# Patient Record
Sex: Female | Born: 1955 | Race: White | Hispanic: No | Smoking: Never smoker
Health system: Southern US, Community
[De-identification: ages and names within clinical notes are randomized; demographics above are authoritative.]

## PROBLEM LIST (undated history)

## (undated) DIAGNOSIS — L719 Rosacea, unspecified: Secondary | ICD-10-CM

## (undated) DIAGNOSIS — D649 Anemia, unspecified: Secondary | ICD-10-CM

## (undated) DIAGNOSIS — Z8719 Personal history of other diseases of the digestive system: Secondary | ICD-10-CM

## (undated) DIAGNOSIS — H269 Unspecified cataract: Secondary | ICD-10-CM

## (undated) DIAGNOSIS — N939 Abnormal uterine and vaginal bleeding, unspecified: Secondary | ICD-10-CM

## (undated) DIAGNOSIS — R011 Cardiac murmur, unspecified: Secondary | ICD-10-CM

## (undated) DIAGNOSIS — M1990 Unspecified osteoarthritis, unspecified site: Secondary | ICD-10-CM

## (undated) HISTORY — PX: OTHER SURGICAL HISTORY: SHX169

## (undated) HISTORY — DX: Rosacea, unspecified: L71.9

## (undated) HISTORY — PX: DILATION AND CURETTAGE OF UTERUS: SHX78

## (undated) HISTORY — DX: Unspecified cataract: H26.9

## (undated) HISTORY — PX: APPENDECTOMY: SHX54

## (undated) HISTORY — PX: TUBAL LIGATION: SHX77

## (undated) HISTORY — PX: ABCESS DRAINAGE: SHX399

## (undated) HISTORY — PX: WISDOM TOOTH EXTRACTION: SHX21

---

## 2005-02-09 ENCOUNTER — Other Ambulatory Visit: Admission: RE | Admit: 2005-02-09 | Discharge: 2005-02-09 | Payer: Self-pay | Admitting: Obstetrics and Gynecology

## 2005-02-16 ENCOUNTER — Encounter: Admission: RE | Admit: 2005-02-16 | Discharge: 2005-02-16 | Payer: Self-pay | Admitting: Obstetrics and Gynecology

## 2005-08-05 ENCOUNTER — Encounter: Admission: RE | Admit: 2005-08-05 | Discharge: 2005-08-05 | Payer: Self-pay | Admitting: Internal Medicine

## 2005-08-11 ENCOUNTER — Encounter: Admission: RE | Admit: 2005-08-11 | Discharge: 2005-08-11 | Payer: Self-pay | Admitting: Internal Medicine

## 2005-11-11 ENCOUNTER — Encounter: Admission: RE | Admit: 2005-11-11 | Discharge: 2005-11-11 | Payer: Self-pay | Admitting: Internal Medicine

## 2005-11-17 ENCOUNTER — Other Ambulatory Visit: Admission: RE | Admit: 2005-11-17 | Discharge: 2005-11-17 | Payer: Self-pay | Admitting: Obstetrics and Gynecology

## 2006-02-08 ENCOUNTER — Encounter: Admission: RE | Admit: 2006-02-08 | Discharge: 2006-02-08 | Payer: Self-pay | Admitting: Internal Medicine

## 2006-03-22 ENCOUNTER — Encounter (INDEPENDENT_AMBULATORY_CARE_PROVIDER_SITE_OTHER): Payer: Self-pay | Admitting: Specialist

## 2006-03-22 ENCOUNTER — Ambulatory Visit (HOSPITAL_COMMUNITY): Admission: RE | Admit: 2006-03-22 | Discharge: 2006-03-22 | Payer: Self-pay | Admitting: Obstetrics and Gynecology

## 2007-10-11 ENCOUNTER — Encounter: Admission: RE | Admit: 2007-10-11 | Discharge: 2007-10-11 | Payer: Self-pay | Admitting: Internal Medicine

## 2009-08-18 ENCOUNTER — Inpatient Hospital Stay (HOSPITAL_COMMUNITY): Admission: AD | Admit: 2009-08-18 | Discharge: 2009-08-18 | Payer: Self-pay | Admitting: Obstetrics and Gynecology

## 2009-08-18 ENCOUNTER — Encounter: Payer: Self-pay | Admitting: Internal Medicine

## 2009-08-24 ENCOUNTER — Ambulatory Visit: Payer: Self-pay | Admitting: Family Medicine

## 2009-08-24 ENCOUNTER — Ambulatory Visit (HOSPITAL_COMMUNITY): Admission: AD | Admit: 2009-08-24 | Discharge: 2009-08-24 | Payer: Self-pay | Admitting: Obstetrics & Gynecology

## 2010-06-22 IMAGING — US US PELVIS COMPLETE MODIFY
1 series · 14 of 25 positions shown · non-contrast
Comparison: None.

CLINICAL DATA: Heavy vaginal bleeding today.  Passing large clots
of blood.  Prior D&C.  The patient is gravida 2, para 2 with LMP
07/16/2009.

TRANSABDOMINAL AND TRANSVAGINAL ULTRASOUND OF PELVIS
TECHNIQUE: Both transabdominal and transvaginal ultrasound
examinations of the pelvis were performed including evaluation of
the uterus, ovaries, adnexal regions, and pelvic cul-de-sac.

[Series 1: us pelvis complete modify · 0.30mm/px · 14 of 49 slices shown]
[im 1/49]
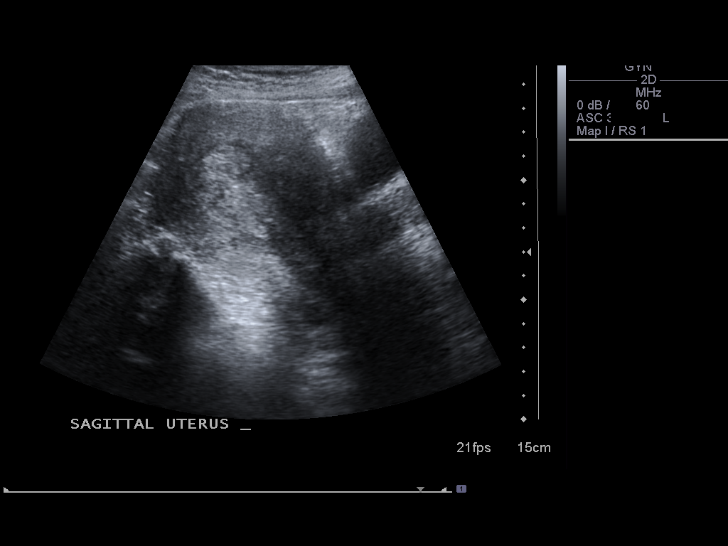
[im 5/49]
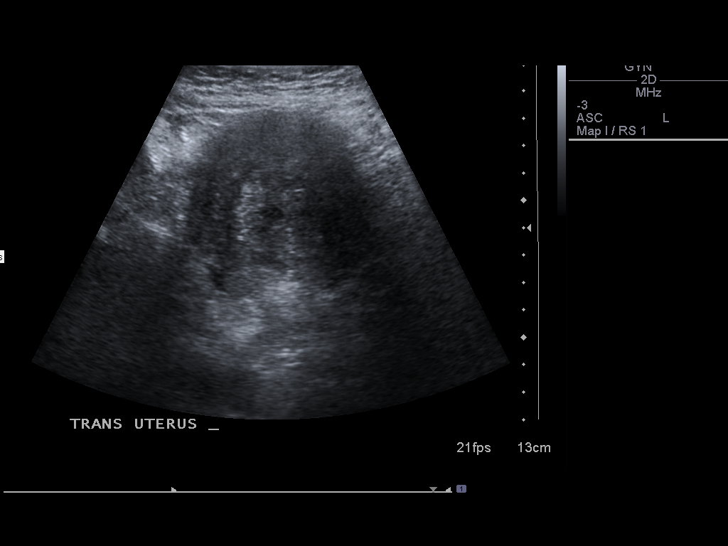
[im 9/49]
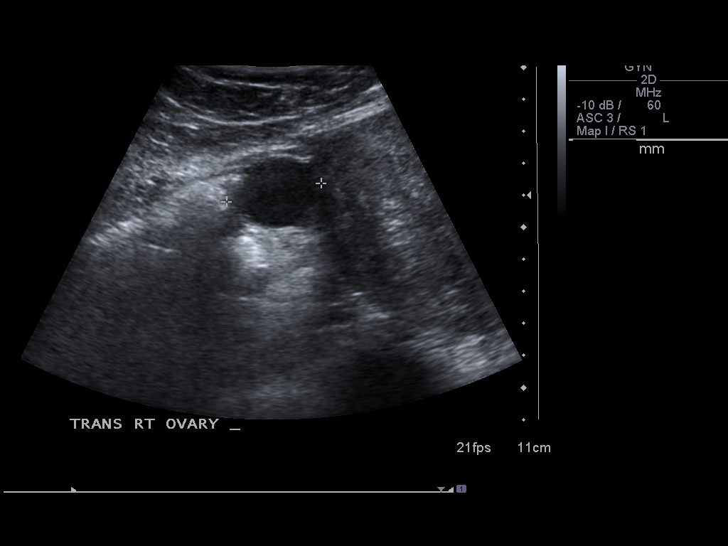
[im 13/49]
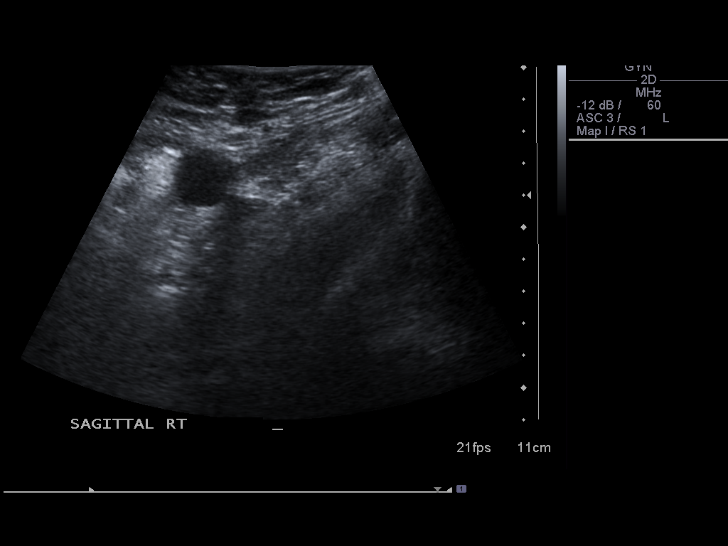
[im 17/49]
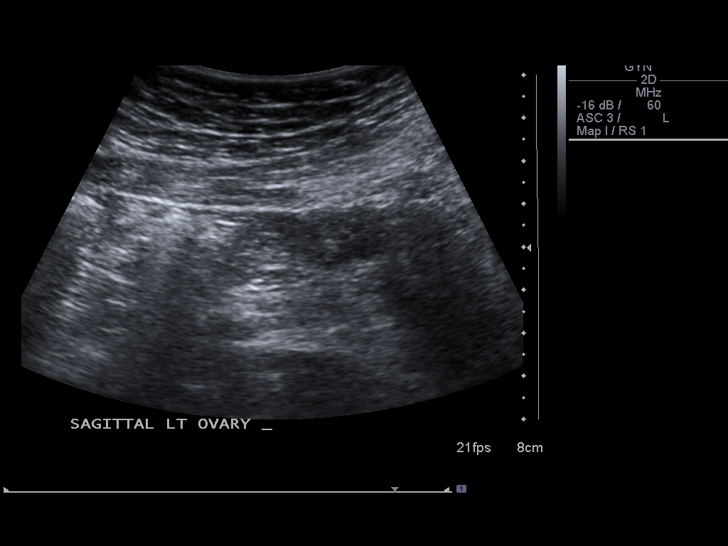
[im 19/49]
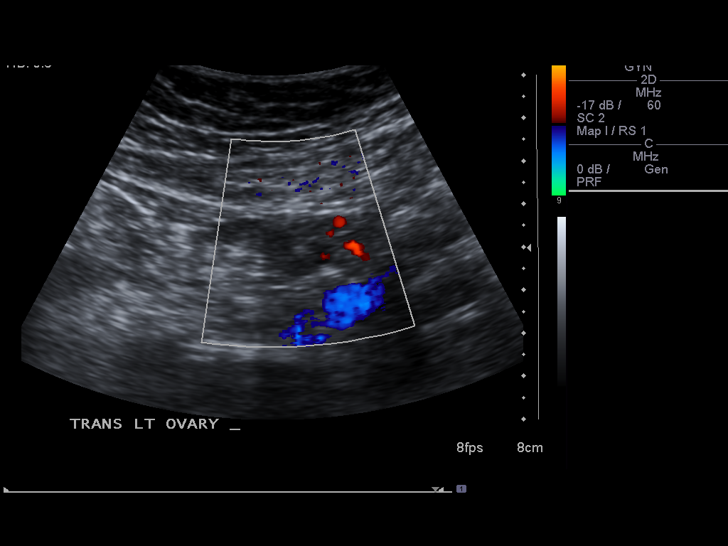
[im 23/49]
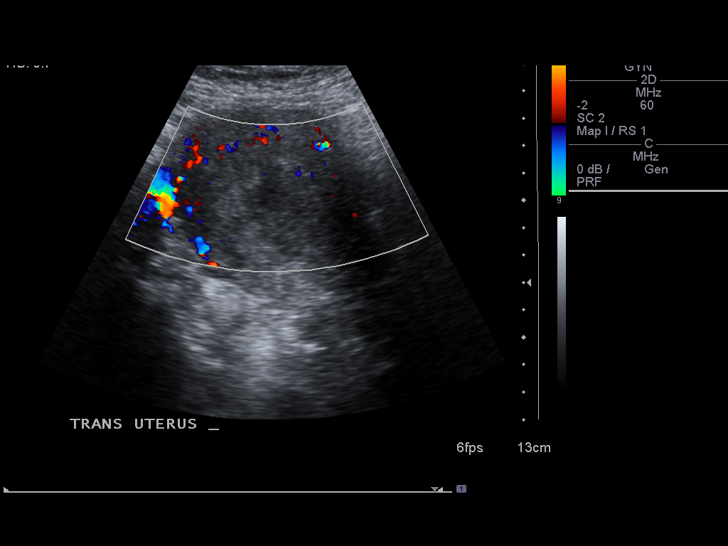
[im 27/49]
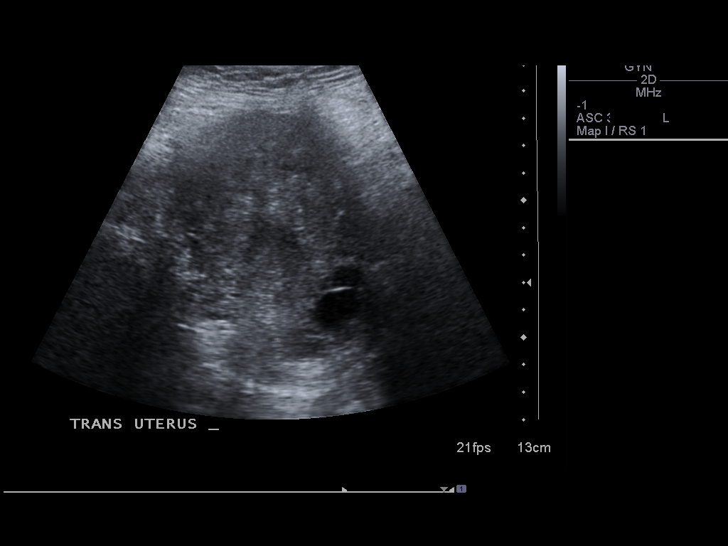
[im 31/49]
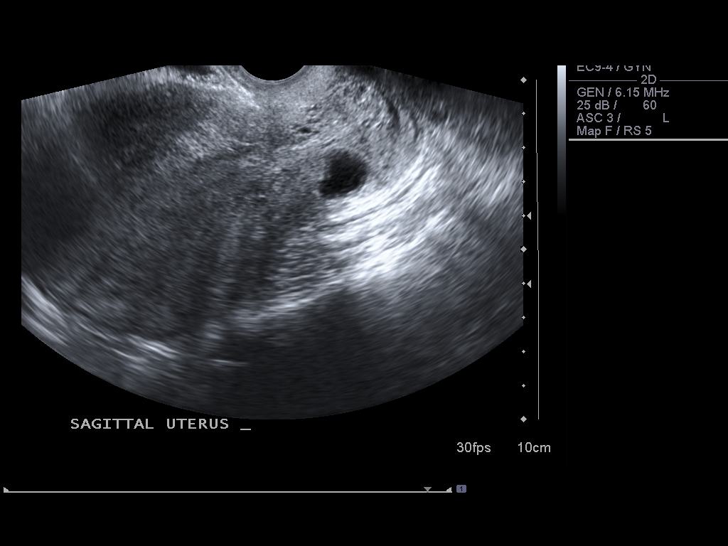
[im 33/49]
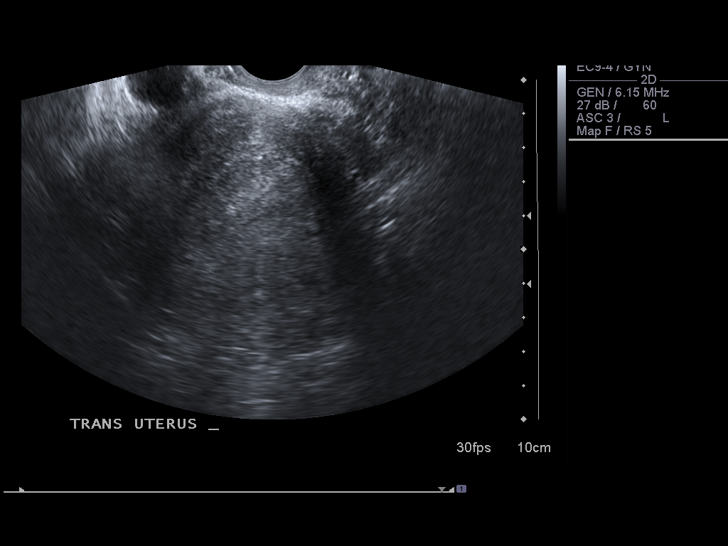
[im 37/49]
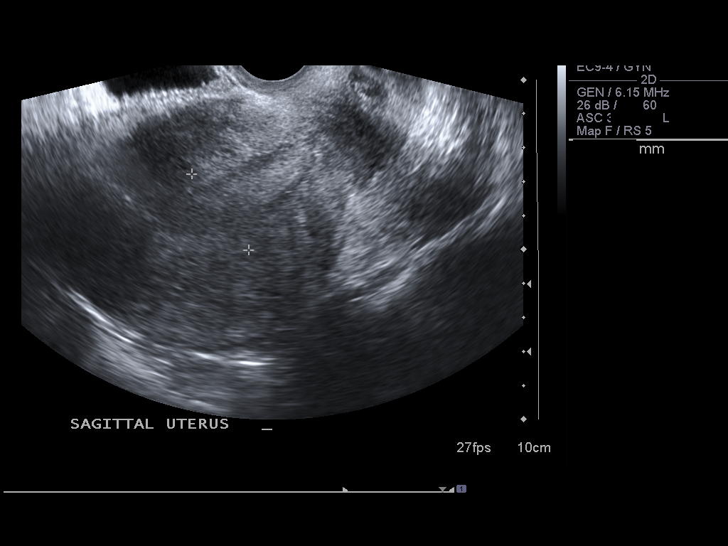
[im 41/49]
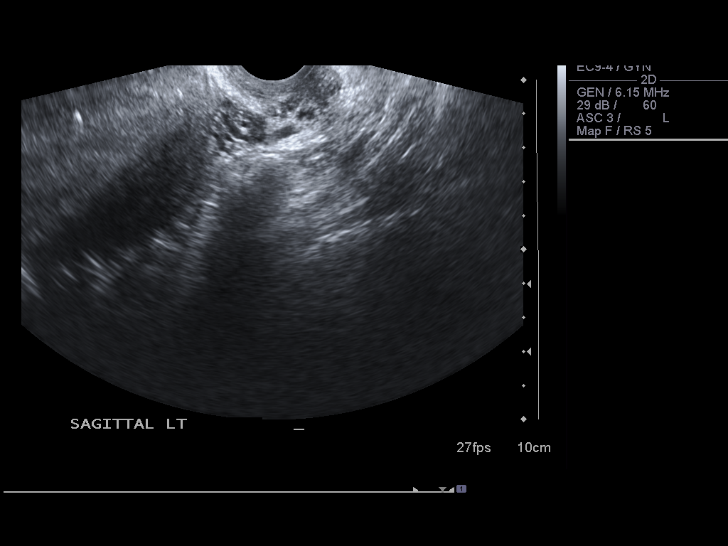
[im 45/49]
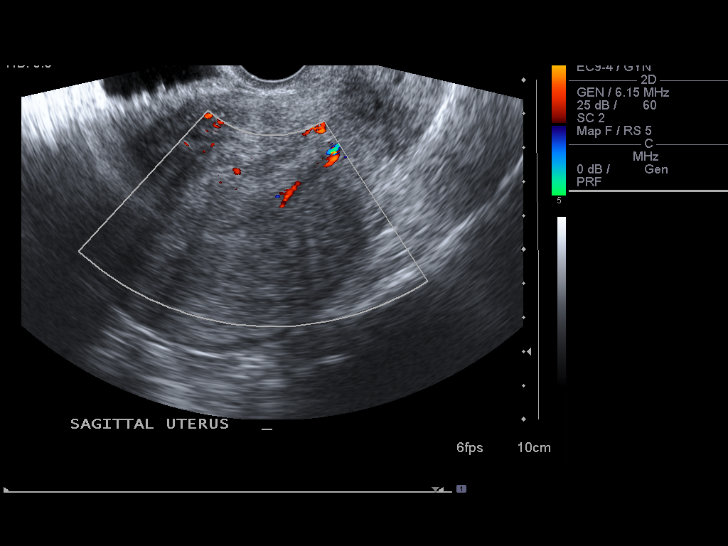
[im 49/49]
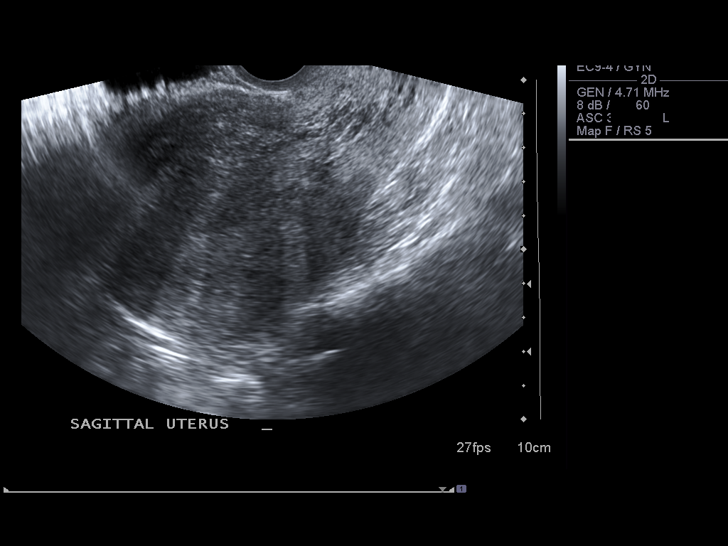

[14 of 25 positions shown; findings below may reference images not displayed]

FINDINGS: Uterus measures 10.5 x 7.4 x 7.5 cm.  The uterus is heterogeneous.
Note is made of multiple Nabothian cysts.

Endometrium measures 2.8 cm.  The endometrium is thickened and
contains echogenic clot.

Right Ovary measures 3.3 x 2.2 x 3.0 cm and has a normal
appearance.

Left Ovary measures 2.1 x 1.6 x 1.6 cm.

Other Findings:  No free pelvic fluid identified.
IMPRESSION: 1.  Thickened endometrium, containing echogenic clot.  If bleeding
is of responsive to hormonal therapy, endometrial biopsy should be
considered.
2.  Normal-appearing ovaries.

## 2010-07-18 ENCOUNTER — Encounter: Payer: Self-pay | Admitting: Internal Medicine

## 2010-09-15 LAB — CROSSMATCH: ABO/RH(D): A POS

## 2010-09-15 LAB — WET PREP, GENITAL
Clue Cells Wet Prep HPF POC: NONE SEEN
Yeast Wet Prep HPF POC: NONE SEEN

## 2010-09-15 LAB — CBC
HCT: 19.3 % — ABNORMAL LOW (ref 36.0–46.0)
Hemoglobin: 6.1 g/dL — CL (ref 12.0–15.0)
MCHC: 31.7 g/dL (ref 30.0–36.0)

## 2010-09-17 LAB — POCT I-STAT, CHEM 8
BUN: 10 mg/dL (ref 6–23)
Calcium, Ion: 1.04 mmol/L — ABNORMAL LOW (ref 1.12–1.32)
HCT: 33 % — ABNORMAL LOW (ref 36.0–46.0)
Hemoglobin: 11.2 g/dL — ABNORMAL LOW (ref 12.0–15.0)
Sodium: 136 mEq/L (ref 135–145)

## 2010-09-17 LAB — TYPE AND SCREEN: ABO/RH(D): A POS

## 2010-09-17 LAB — DIFFERENTIAL
Basophils Absolute: 0 10*3/uL (ref 0.0–0.1)
Eosinophils Absolute: 0 10*3/uL (ref 0.0–0.7)
Eosinophils Relative: 0 % (ref 0–5)
Lymphocytes Relative: 9 % — ABNORMAL LOW (ref 12–46)
Monocytes Relative: 8 % (ref 3–12)
Neutrophils Relative %: 83 % — ABNORMAL HIGH (ref 43–77)

## 2010-09-17 LAB — POCT PREGNANCY, URINE: Preg Test, Ur: NEGATIVE

## 2010-09-17 LAB — PROTIME-INR
INR: 1.1 (ref 0.00–1.49)
Prothrombin Time: 14.1 seconds (ref 11.6–15.2)

## 2010-09-17 LAB — CBC: MCHC: 32.9 g/dL (ref 30.0–36.0)

## 2010-11-12 NOTE — Op Note (Signed)
Nancy Fields, Nancy Fields NO.:  [id number]   MEDICAL RECORD NO.:  [id number]          PATIENT TYPE:  AMB   LOCATION:  [facility]                           FACILITY:  [facility]   PHYSICIAN:  Artist Pais, M.D.    DATE OF BIRTH:  [birth year]   DATE OF PROCEDURE:  03/22/2006  DATE OF DISCHARGE:  03/22/2006                                 OPERATIVE REPORT   PREOPERATIVE DIAGNOSES:  Menorrhagia, endometrial polyps.   POSTOPERATIVE DIAGNOSES:  Menorrhagia, endometrial polyps. Stippled  endometrium noted. No definite polyps noted.   PROCEDURE:  Dilatation and curettage, hysteroscopy.   SURGEON:  Artist Pais, MD, PhD.   ASSISTANT:  None.   ANESTHESIA:  General by LMA.   SPECIMENS:  Endometrial curettings to pathology.   ESTIMATED BLOOD LOSS:  Minimal.   COMPLICATIONS:  None.   FLUIDS:  Approximately 600 mL of Crystalloid.   DESCRIPTION OF PROCEDURE:  The patient was brought to the operating room,  identified on the operating room table. After induction of adequate general  anesthesia was by LMA, the patient was placed in the dorsal lithotomy  position and prepped and draped in the usual sterile fashion. The bladder  was straight cathed for approximately 50 mL of clear yellow urine. An  examination under anesthesia revealed the uterus to the anteverted and 10  weeks, slightly irregular consistent with a fibroid uterus. Of note, the  patient told a hospital staff here today that she has a LATEX allergy and a  PENICILLIN allergy. When she came to our office in August 2006, she denied  any allergies and I did ask her about this quite specifically. In addition,  I saw her for a visit last week at which I pointedly asked her about  allergies and she denied any allergies at all. In any event, the speculum  was placed and the cervix was infiltrated with 20 mL of 1% lidocaine for a  paracervical block. The cervix was then gently dilated up to a #21 Pratt  dilator. Dilatation  proceeded slowly and carefully to decrease the risk of  uterine perforation. She was noted to have an area at about the 1 o'clock  position of the cervix which was a little more stenotic but I was able to  gently dilate past this area without any force whatsoever. Subsequently the  new [facility] hysteroscope was placed. A very careful and thorough hysteroscopic  examination was performed. I am somewhat concerned as the endometrium looked  somewhat stippled and whirled and this could well be due to her fibroid  uterus. I did not identify any polyps. I did see some free floating areas of  endometrial tissue in the lower uterine segment. Both tubal ostia were  identified. Subsequently, the scope was removed. I should be noted that  sorbitol was used as a distending medium. Of note, the uterus did note to  sound to about 7.5 cm. Subsequently the serrated curette was placed and  careful curettage was performed. I curetted in a systematic clockwise  fashion with tissue obtained. Curettage proceeded very carefully and gently  to decrease the risk of uterine perforation. Subsequently the scope was  again placed and there was one area at the fundus which was noted to have  not been curetted and so I gently recuretted the uterus in a systematic  clockwise fashion. Adequate tissue was obtained. Subsequently I replaced the  scope again and was able to see that I curetted the entire endometrial  cavity and thus this was well curetted. At that point, the procedure was  then terminated. The [facility] hysteroscope was removed. The tenaculum was  removed. There was noted to be minimal bleeding from the tenaculum site  which resolved with a small amount of pressure. At that point, the patient  was subsequently transferred to the recovery room in stable condition after  instrument, sponge, and needle counts were correct. The patient received a  postoperative instruction sheet and told that she could take ibuprofen 400   mg every 6h as needed for pain. She will return to the office in 2-3 weeks  for a followup evaluation.   I have explained to the patient on previous occasions that I am quite  concerned that she delayed this surgery for so long and I did fill that I  needed to treat her with Provera because of her history of anemia. I did not  want her to bleed so much that she became even more anemic. I did learn at  her visit last week that she had had 2 deaths in the family but the first  one which delayed her surgery was her exhusband. Please see copy of the  preoperative history and physical. We ultimately had called her and told her  that we would no longer fill her prescription unless she had her procedure  and she said that she was saving up the money for the copayment. At that  point, I did offer to send her to the GYN clinic because I was concerned and  I have explained to her that these doses of Provera could obscure the  intrauterine pathology, that is obscure an endometrial cancer. She  significantly tried to talk me out of doing the surgery at her last visit.  In any event, she will return to the office in 2 weeks for a followup  evaluation and I will again explore with her also her PENICILLIN allergy and  LATEX allergy as I asked her on numerous occasions about allergies. Of note,  the patient is a poor historian.           ______________________________  Artist Pais, M.D.     DC/MEDQ  D:  03/22/2006  T:  03/24/2006  Job:  [id number]   cc:   Georgann Housekeeper, MD  Fax: [phone]

## 2010-11-12 NOTE — H&P (Signed)
NAMEMarland Kitchen  NOCOLE, ZAMMIT NO.:  [id number]   MEDICAL RECORD NO.:  [id number]          PATIENT TYPE:  AMB   LOCATION:  [facility]                           FACILITY:  [facility]   PHYSICIAN:  Artist Pais, M.D.    DATE OF BIRTH:  1955-11-10   DATE OF ADMISSION:  03/22/2006  DATE OF DISCHARGE:                                HISTORY & PHYSICAL   HISTORY OF PRESENT ILLNESS:  The patient is a 55 year old Caucasian female,  para 2 who I have been following for menorrhagia. She has complained of  significant menorrhagia such that she has very heavy cycles with clots the  first 3 days. The patient was complaining of extremely large clots. She  underwent a pelvic ultrasound which showed uterine fibroids, some with  cystic degeneration. There was noted to be a hyperechoic mass in the  endometrium. She subsequently underwent a sonohysterogram on Nov 15, 2005  and was found to have a polypoid lesion in the fundal portion of the uterus.  The dimensions of this polypoid lesion were noted to be 1.6 x 0.8 cm.  However, she had multiple other hyperechoic masses seen all of which were 8  mm or smaller. The patient was advised to undergo a D&C hysteroscopy and was  subsequently scheduled for this. She however called saying that she was  unable to keep her surgery date because of a death in her family. She did  undergo an endometrial biopsy to rule out endometrial cancer and was found  to have superficial strips of weakly proliferative endometrium. When she  came in for her preop visit, I learned that the death in the family was  really her ex-husband from whom she had been divorced for 16 years. But in  any event, she says the money that she had used to travel to his funeral was  money that she needed for the copayment for a D&C hysteroscopy and thus she  did not schedule it until just recently. Since that time, she has had  another death in the family and was thinking about not having surgery at  this time. The patient has had such significant menorrhagia that I did begin  to treat her with Provera just prior to the initially scheduled D&C  hysteroscopy to control the bleeding. The patient would prefer to continue  taking the Provera without having the D&C hysteroscopy. I have explained to  her that I am extremely worried that her taking Provera for this length of  time could have altered the pathology results that I would have otherwise  obtained on [facility] hysteroscopy and polypectomy and we did strongly encourage  her and keep up with her to go ahead and schedule this surgery. She  ultimately has agreed to undergo the surgery but says she still would just  prefer to take the Provera because she does not have any bleeding with this.  I continually explained that I am concerned that the Provera could alter the  pathology of the cells from the polyp thus leading to a misdiagnosis that is  missing a diagnosis  of cancer.  In any event, she agrees to undergo the [facility]  hysteroscopy and polypectomy. The risks of surgery including anesthetic  complications, hemorrhage, infection, damage to adjacent structures  including bladder, bowel, blood vessels were discussed with the patient. She  is made aware of the risks of uterine perforation which could trip off an  overwhelming life-threatening hemorrhage requiring emergent hysterectomy or  uterine perforation which could result in bowel damage requiring emergent  colostomy or which could result in overwhelming life-threatening  peritonitis. She expresses an understanding of and acceptance of these  risks.   PAST MEDICAL HISTORY:  Insomnia, hiatal hernia, dizziness and rectal  bleeding. She has been referred back to her primary care physician for the  rectal bleeding and actually did this on February 09, 2005.   ALLERGIES:  NKDA.   OB/GYN HISTORY:  Menarche age 66. Contraception, bilateral tubal ligation.  Cycle interval duration as indicated  above.   CURRENT MEDICATIONS:  Provera 5 mg daily.   PAST SURGICAL HISTORY:  Spontaneous vaginal delivery x2.   REVIEW OF SYSTEMS:  Noncontributory except as noted above. Denies headache,  visual changes, chest pain, shortness of breath, abdominal pain, change in  bowel habits, unintentional weight loss, dysuria, urgency, frequency,  vaginal pruritus or discharge, pain or bleeding with intercourse but she has  not had intercourse for the last 8 years.   FAMILY HISTORY:  There is no family history of colon, breast, ovarian or  prostate cancer. Her mother died at 47 of lung cancer. She also had  hypertension. Her father died at 14 of a stroke. She has 9 siblings and also  gives a history of a maternal aunt dying of stomach and thyroid cancer and a  maternal grandmother dying of some rectal cause of death, but she does not  know if this was cancer. She has 2 children ages 73 and 48 alive and well.   SOCIAL HISTORY:  Occupation, the patient works at [employer]. She is  divorced. She does not have a current sexual partner. Tobacco none. Alcohol  socially.   PHYSICAL EXAMINATION:  GENERAL:  Well-developed Caucasian female.  VITAL SIGNS:  Blood pressure 110/80, heart rate 76, weight 170, height 62-  1/4 inches.  HEENT:  Normal.  NECK:  Supple without thyromegaly, adenopathy or nodule.  CHEST:  Clear to auscultation.  BREASTS:  Symmetrical without masses noted, __________  retraction or nipple  discharge.  CARDIAC:  Regular rate and rhythm without S3 sounds or murmurs.  ABDOMEN:  Soft and nontender. No hepatosplenomegaly or masses.  EXTREMITIES:  No cyanosis, clubbing or edema.  NEUROLOGIC:  Oriented x3, grossly normal.  PELVIC:  Normal external female genitalia. No vulvar, vaginal or cervical  lesion. Pap smear was recently performed on Nov 17, 2005 and was found to be  an ASCUS Pap. She has a previous ASCUS Pap and is to undergo a colposcopy a few weeks after her D&C hysteroscopy and  polypectomy. Bimanual examination  reveals the uterus to be anteverted, approximately 10 weeks. __________  mobile, slightly irregular consistent with fibroid uterus without any  adnexal mass or tenderness.  RECTAL:  Excellent sphincter tone. Confirms pelvic exam. No masses palpated.   IMPRESSION:  The patient is a 55 year old Caucasian female, para 2 with  multiple endometrial polyps on sonohysterogram. She was originally scheduled  to have a D&C hysteroscopy in June and I was unable to perform this because  I was ill. We rescheduled her immediately thereafter but she canceled this  appointment due to a death in her family. We have continually called her to  reschedule this appointment and she has refused up until now at which point  I had left a message with her [facility] nurse that I would not continue to fill  her Provera unless she underwent the D&C hysteroscopy and polypectomy.  Furthermore, I have explained to her that her failure to obtain this D&C  hysteroscopy in a timely fashion having been treated with Provera to  decrease her menorrhagia could inexorably alter the pathology thus leading  to a missed diagnosis. I felt I had to treat her with Provera given the fact  that she  continued to have copious amounts of bleeding such that she was anemic. The  patient expresses understanding of these risks and freely confesses that she  did not followup with respect to scheduling the surgery. She has had a  recent CBC which shows a normal hemoglobin measuring 14.1 with a normal  white count of 6700.           ______________________________  Artist Pais, M.D.     DC/MEDQ  D:  03/21/2006  T:  03/22/2006  Job:  [id number]   cc:   Georgann Housekeeper, MD  Fax: [phone]

## 2011-03-16 ENCOUNTER — Encounter (HOSPITAL_COMMUNITY): Payer: Self-pay | Admitting: *Deleted

## 2011-03-16 ENCOUNTER — Inpatient Hospital Stay (HOSPITAL_COMMUNITY): Payer: Self-pay

## 2011-03-16 ENCOUNTER — Inpatient Hospital Stay (HOSPITAL_COMMUNITY)
Admission: AD | Admit: 2011-03-16 | Discharge: 2011-03-16 | Disposition: A | Discharge status: Home / Self Care | Payer: Self-pay | Source: Ambulatory Visit | Attending: Obstetrics & Gynecology | Admitting: Obstetrics & Gynecology

## 2011-03-16 DIAGNOSIS — N949 Unspecified condition associated with female genital organs and menstrual cycle: Secondary | ICD-10-CM

## 2011-03-16 DIAGNOSIS — D649 Anemia, unspecified: Secondary | ICD-10-CM | POA: Insufficient documentation

## 2011-03-16 DIAGNOSIS — N938 Other specified abnormal uterine and vaginal bleeding: Secondary | ICD-10-CM | POA: Insufficient documentation

## 2011-03-16 HISTORY — DX: Abnormal uterine and vaginal bleeding, unspecified: N93.9

## 2011-03-16 LAB — COMPREHENSIVE METABOLIC PANEL
ALT: 16 U/L (ref 0–35)
AST: 23 U/L (ref 0–37)
Alkaline Phosphatase: 71 U/L (ref 39–117)
Chloride: 104 mEq/L (ref 96–112)
Creatinine, Ser: 0.7 mg/dL (ref 0.50–1.10)
GFR calc non Af Amer: >60 mL/min (ref >60)
Glucose, Bld: 92 mg/dL (ref 70–99)
Sodium: 138 mEq/L (ref 135–145)
Total Bilirubin: 0.3 mg/dL (ref 0.3–1.2)
Total Protein: 6.4 g/dL (ref 6.0–8.3)

## 2011-03-16 LAB — CBC
Hemoglobin: 10.8 g/dL — ABNORMAL LOW (ref 12.0–15.0)
MCH: 32 pg (ref 26.0–34.0)
MCHC: 32.7 g/dL (ref 30.0–36.0)
Platelets: 277 10*3/uL (ref 150–400)
WBC: 5.6 10*3/uL (ref 4.0–10.5)

## 2011-03-16 LAB — POCT PREGNANCY, URINE: Preg Test, Ur: NEGATIVE

## 2011-03-16 LAB — WET PREP, GENITAL: Trich, Wet Prep: NONE SEEN

## 2011-03-16 MED ORDER — MEDROXYPROGESTERONE ACETATE 10 MG PO TABS: 10.0000 mg | ORAL_TABLET | Freq: Every day | ORAL | Status: DC

## 2011-03-16 NOTE — ED Provider Notes (Signed)
History     CSN: [id number] Arrival date & time: 03/16/2011  9:06 AM   Chief Complaint  Patient presents with  . Vaginal Bleeding     (Include location/radiation/quality/duration/timing/severity/associated sxs/prior treatment) HPI Nancy Fields is a 55 y.o. female who presents to [facility] for heavy vaginal bleeding that started 6 weeks ago. History of same problem about 18 months ago and Dr. Shawnie Pons did D&C. Had been doing well until this episode of bleeding started.   No past medical history on file.   No past surgical history on file.  No family history on file.  History  Substance Use Topics  . Smoking status: Not on file  . Smokeless tobacco: Not on file  . Alcohol Use: Not on file    OB History    No data available      Review of Systems  Constitutional: Positive for appetite change, fatigue and unexpected weight change. Negative for fever, chills and diaphoresis.  HENT: Negative for ear pain, congestion, sore throat, facial swelling, neck pain, neck stiffness, dental problem and sinus pressure.   Eyes: Positive for visual disturbance. Negative for photophobia, pain and discharge.  Respiratory: Positive for chest tightness. Negative for cough and wheezing.   Cardiovascular: Positive for chest pain and palpitations.  Gastrointestinal: Positive for abdominal pain and constipation. Negative for nausea, vomiting, diarrhea and abdominal distention.  Genitourinary: Positive for vaginal bleeding and pelvic pain. Negative for dysuria, frequency, flank pain and difficulty urinating.  Musculoskeletal: Positive for back pain. Negative for myalgias and gait problem.  Skin: Negative for color change and rash.  Neurological: Positive for dizziness, light-headedness and headaches. Negative for speech difficulty, weakness and numbness.  Hematological: Bruises/bleeds easily.  Psychiatric/Behavioral: Negative for confusion and agitation.    Allergies  Penicillins  Home Medications  No  current outpatient prescriptions on file.  Physical Exam    There were no vitals taken for this visit.  Physical Exam  Nursing note and vitals reviewed. Constitutional: She is oriented to person, place, and time. She appears well-developed and well-nourished.  Eyes: EOM are normal.  Neck: Neck supple.  Pulmonary/Chest: Effort normal.  Abdominal: Soft. There is no tenderness.  Musculoskeletal: Normal range of motion.  Neurological: She is alert and oriented to person, place, and time. No cranial nerve deficit.  Skin: Skin is warm and dry.    ED Course  Procedures US Transvaginal Non-OB      *RADIOLOGY REPORT*   Clinical Data: Pelvic pain and menorrhagia   TRANSABDOMINAL AND TRANSVAGINAL ULTRASOUND OF PELVIS Technique:  Both transabdominal and transvaginal ultrasound examinations of the pelvis were performed. Transabdominal technique was performed for global imaging of the pelvis including uterus, ovaries, adnexal regions, and pelvic cul-de-sac.   Comparison: 02/22/2011ultrasound, pelvic MRI 08/11/2005    It was necessary to proceed with endovaginal exam following the transabdominal exam to visualize the ovaries, neither seen transabdominally.   Findings:   Uterus: 12.3 x 9.0 x 6.5 cm.  Globally enlarged and inhomogeneous appearing myometrium without focal measurable lesion.   Endometrium: 14 mm.  Inhomogeneously echogenic without focal measurable abnormality.   Right ovary:  Not visualized.  No adnexal mass.   Left ovary: Not visualized.  No adnexal mass.   Other findings: No free fluid   IMPRESSION: Global persistent uterine enlargement without focal measurable mass, which may represent adenomyosis as seen on prior studies.   Original Report Authenticated By: Harrel Lemon, M.D.  Results for orders placed during the hospital encounter of 03/16/11 (from the  past 24 hour(s))  POCT PREGNANCY, URINE     Status: Normal   Collection Time   03/16/11  9:59 AM       Component Value Range   Preg Test, Ur NEGATIVE    CBC     Status: Abnormal   Collection Time   03/16/11 10:20 AM      Component Value Range   WBC 5.6  4.0 - 10.5 (K/uL)   RBC 3.37 (*) 3.87 - 5.11 (MIL/uL)   Hemoglobin 10.8 (*) 12.0 - 15.0 (g/dL)   HCT 16.1 (*) 09.6 - 46.0 (%)   MCV 97.9  78.0 - 100.0 (fL)   MCH 32.0  26.0 - 34.0 (pg)   MCHC 32.7  30.0 - 36.0 (g/dL)   RDW 04.5  40.9 - 81.1 (%)   Platelets 277  150 - 400 (K/uL)  SAMPLE TO BLOOD BANK     Status: Normal   Collection Time   03/16/11 10:20 AM      Component Value Range   Blood Bank Specimen SAMPLE AVAILABLE FOR TESTING     Sample Expiration 03/19/2011    COMPREHENSIVE METABOLIC PANEL     Status: Normal   Collection Time   03/16/11 10:20 AM      Component Value Range   Sodium 138  135 - 145 (mEq/L)   Potassium 3.6  3.5 - 5.1 (mEq/L)   Chloride 104  96 - 112 (mEq/L)   CO2 27  19 - 32 (mEq/L)   Glucose, Bld 92  70 - 99 (mg/dL)   BUN 7  6 - 23 (mg/dL)   Creatinine, Ser 9.14  0.50 - 1.10 (mg/dL)   Calcium 9.5  8.4 - 78.2 (mg/dL)   Total Protein 6.4  6.0 - 8.3 (g/dL)   Albumin 3.5  3.5 - 5.2 (g/dL)   AST 23  0 - 37 (U/L)   ALT 16  0 - 35 (U/L)   Alkaline Phosphatase 71  39 - 117 (U/L)   Total Bilirubin 0.3  0.3 - 1.2 (mg/dL)   GFR calc non Af Amer >60  >60 (mL/min)   GFR calc Af Amer >60  >60 (mL/min)  WET PREP, GENITAL     Status: Abnormal   Collection Time   03/16/11 11:04 AM      Component Value Range   Yeast, Wet Prep NONE SEEN  NONE SEEN    Trich, Wet Prep NONE SEEN  NONE SEEN    Clue Cells, Wet Prep NONE SEEN  NONE SEEN    WBC, Wet Prep HPF POC FEW (*) NONE SEEN    Assessment: Dysfunctional uterine bleeding   Anemia  Plan:  Consult with Dr. Debroah Loop   Provera 10 mg. Qd  # 30   Follow up in GYN Clinic   OTC vit. With [facility], NP 03/16/11 1327

## 2011-03-16 NOTE — ED Notes (Signed)
RN called [facility] and made pt an appointment for 04/11/11;

## 2011-03-16 NOTE — ED Notes (Signed)
States last D&C done by Dr. Shawnie Pons. Before that went to Jeri Modena, MD

## 2011-03-16 NOTE — Progress Notes (Signed)
Pt states has been bleeding heavily x6 weeks, has had prior d&c via Dr. Shawnie Pons. Changing pad q2-3 hours, many clots large and small, consistency of "liver". Denies pain at present, has had intermittent cramping.

## 2011-04-11 ENCOUNTER — Encounter: Payer: Self-pay | Admitting: Obstetrics and Gynecology

## 2011-12-29 ENCOUNTER — Encounter (HOSPITAL_COMMUNITY): Payer: Self-pay | Admitting: *Deleted

## 2011-12-29 ENCOUNTER — Inpatient Hospital Stay (HOSPITAL_COMMUNITY)
Admission: AD | Admit: 2011-12-29 | Discharge: 2011-12-29 | Disposition: A | Discharge status: Home / Self Care | Payer: Self-pay | Source: Ambulatory Visit | Attending: Obstetrics & Gynecology | Admitting: Obstetrics & Gynecology

## 2011-12-29 ENCOUNTER — Inpatient Hospital Stay (HOSPITAL_COMMUNITY): Payer: Self-pay

## 2011-12-29 DIAGNOSIS — D649 Anemia, unspecified: Secondary | ICD-10-CM | POA: Insufficient documentation

## 2011-12-29 DIAGNOSIS — N898 Other specified noninflammatory disorders of vagina: Secondary | ICD-10-CM

## 2011-12-29 DIAGNOSIS — N939 Abnormal uterine and vaginal bleeding, unspecified: Secondary | ICD-10-CM

## 2011-12-29 DIAGNOSIS — D259 Leiomyoma of uterus, unspecified: Secondary | ICD-10-CM | POA: Insufficient documentation

## 2011-12-29 DIAGNOSIS — N949 Unspecified condition associated with female genital organs and menstrual cycle: Secondary | ICD-10-CM | POA: Insufficient documentation

## 2011-12-29 DIAGNOSIS — N938 Other specified abnormal uterine and vaginal bleeding: Secondary | ICD-10-CM | POA: Insufficient documentation

## 2011-12-29 DIAGNOSIS — N926 Irregular menstruation, unspecified: Secondary | ICD-10-CM

## 2011-12-29 LAB — CBC
HCT: 30.8 % — ABNORMAL LOW (ref 36.0–46.0)
Hemoglobin: 9.5 g/dL — ABNORMAL LOW (ref 12.0–15.0)
RBC: 3.78 MIL/uL — ABNORMAL LOW (ref 3.87–5.11)
WBC: 5.3 10*3/uL (ref 4.0–10.5)

## 2011-12-29 MED ORDER — MEDROXYPROGESTERONE ACETATE 150 MG/ML IM SUSP
150.0000 mg | Freq: Once | INTRAMUSCULAR | Status: AC
  Administered 2011-12-29: 150 mg via INTRAMUSCULAR
  Filled 2011-12-29: qty 1

## 2011-12-29 NOTE — MAU Provider Note (Signed)
History     CSN: [id number]  Arrival date & time 12/29/11  1205   None     Chief Complaint  Patient presents with  . Vaginal Bleeding    HPI Nancy Fields is a 56 y.o. female who presents to MAU for vaginal bleeding. The bleeding started 6 weeks ago. She describes the bleeding as heavier than a period and passing clots. The bleeding started as spotting and then like a regular period but continued and now is much worse. Woke this morning and bed sheets were covered with blood. She denies pain. Patient was here in September and was to follow up in [facility] but did not make the appointment. Patient had surgery, D&C by Dr. Shawnie Pons 2011 and was to follow up in the GYN Clinic to discuss results but did not follow up. Had D&C 4 years before for abnormal bleeding. Decreased appetite and weight loss of 50 pounds since 2007. Not sexually active in years. Patient states that she thinks she may have cancer but does not go for her follow up appointments she just comes to the ED when the symptoms get to bad to stay at home.   Past Medical History  Diagnosis Date  . Abnormal vaginal bleeding     Past Surgical History  Procedure Date  . Dilation and curettage of uterus   . Abcess drainage   . Appendectomy   . Bilateral tubal ligation     History reviewed. No pertinent family history.  History  Substance Use Topics  . Smoking status: Never Smoker   . Smokeless tobacco: Not on file  . Alcohol Use: No    OB History    Grav Para Term Preterm Abortions TAB SAB Ect Mult Living   2 2 2  0 0 0 0 0 0 2      Review of Systems  Constitutional: Positive for appetite change. Negative for fever, chills, diaphoresis and fatigue.  HENT: Negative for ear pain, congestion, sore throat, facial swelling, neck pain, neck stiffness, dental problem and sinus pressure.   Eyes: Negative for photophobia, pain and discharge.  Respiratory: Negative for cough, chest tightness and wheezing.   Cardiovascular: Negative  for chest pain, palpitations and leg swelling.  Gastrointestinal: Negative for nausea, vomiting, abdominal pain, diarrhea, constipation and abdominal distention.  Genitourinary: Positive for vaginal bleeding. Negative for dysuria, urgency, frequency, flank pain, difficulty urinating and pelvic pain.  Musculoskeletal: Negative for myalgias, back pain and gait problem.  Skin: Negative for color change and rash.  Neurological: Positive for light-headedness and headaches. Negative for dizziness, speech difficulty, weakness and numbness.  Psychiatric/Behavioral: Negative for confusion and agitation. The patient is not nervous/anxious.     Allergies  Penicillins and Latex  Home Medications  No current outpatient prescriptions on file.  BP 118/85  Pulse 81  Temp 98.8 F (37.1 C) (Oral)  Resp 18  Ht 5' 0.5" (1.537 m)  Wt 147 lb 6.4 oz (66.86 kg)  BMI 28.31 kg/m2  SpO2 100%  Physical Exam  Nursing note and vitals reviewed. Constitutional: She is oriented to person, place, and time. She appears well-developed and well-nourished.  Eyes: EOM are normal.  Neck: Neck supple.  Pulmonary/Chest: Effort normal.  Abdominal: Soft. There is no tenderness.  Genitourinary:       External genitalia without lesions. Moderate blood vaginal vault. No CMT, no adnexal tenderness. Uterus enlarged.  Musculoskeletal: Normal range of motion.  Neurological: She is alert and oriented to person, place, and time. No cranial  nerve deficit.  Skin: Skin is warm and dry.  Psychiatric: She has a normal mood and affect. Her behavior is normal. Judgment and thought content normal.   Results for orders placed during the hospital encounter of 12/29/11 (from the past 24 hour(s))  CBC     Status: Abnormal   Collection Time   12/29/11  1:25 PM      Component Value Range   WBC 5.3  4.0 - 10.5 K/uL   RBC 3.78 (*) 3.87 - 5.11 MIL/uL   Hemoglobin 9.5 (*) 12.0 - 15.0 g/dL   HCT 19.1 (*) 47.8 - 29.5 %   MCV 81.5  78.0 -  100.0 fL   MCH 25.1 (*) 26.0 - 34.0 pg   MCHC 30.8  30.0 - 36.0 g/dL   RDW 62.1 (*) 30.8 - 65.7 %   Platelets 271  150 - 400 K/uL   US Transvaginal Non-ob  12/29/2011  *RADIOLOGY REPORT*  Clinical Data: Bleeding.  TRANSABDOMINAL AND TRANSVAGINAL ULTRASOUND OF PELVIS Technique:  Both transabdominal and transvaginal ultrasound examinations of the pelvis were performed. Transabdominal technique was performed for global imaging of the pelvis including uterus, ovaries, adnexal regions, and pelvic cul-de-sac.  It was necessary to proceed with endovaginal exam following the transabdominal exam to visualize the endometrium.  Comparison:  12/14/2010.  Findings:  Uterus: 11.7 x 7.1 x 9.1 cm.  Heterogeneous echotexture with scattered cystic changes.  Endometrium: Difficult to delineate an appearing thick and measuring up to16.5 mm.  Right ovary:  4.2 x 1.9 x 2.2 cm.  No dominant worrisome mass.  Left ovary: 3.2 x 1.9 x 1.7 cm. No dominant worrisome mass.  Other findings: No free fluid  IMPRESSION: Heterogeneous appearance of the uterus with scattered cystic changes most notable frontal region. This may represent changes of adenomyosis as previously discussed.  Difficult to assess the endometrioma appearing thickened measuring up to 16.5 mm.  Given the patient's age and clinical history with the appearance of the endometrium, tumor not excluded.  This will require follow-up.  Original Report Authenticated By: Fuller Canada, M.D.   US Pelvis Complete  12/29/2011  *RADIOLOGY REPORT*  Clinical Data: Bleeding.  TRANSABDOMINAL AND TRANSVAGINAL ULTRASOUND OF PELVIS Technique:  Both transabdominal and transvaginal ultrasound examinations of the pelvis were performed. Transabdominal technique was performed for global imaging of the pelvis including uterus, ovaries, adnexal regions, and pelvic cul-de-sac.  It was necessary to proceed with endovaginal exam following the transabdominal exam to visualize the endometrium.   Comparison:  12/14/2010.  Findings:  Uterus: 11.7 x 7.1 x 9.1 cm.  Heterogeneous echotexture with scattered cystic changes.  Endometrium: Difficult to delineate an appearing thick and measuring up to16.5 mm.  Right ovary:  4.2 x 1.9 x 2.2 cm.  No dominant worrisome mass.  Left ovary: 3.2 x 1.9 x 1.7 cm. No dominant worrisome mass.  Other findings: No free fluid  IMPRESSION: Heterogeneous appearance of the uterus with scattered cystic changes most notable frontal region. This may represent changes of adenomyosis as previously discussed.  Difficult to assess the endometrioma appearing thickened measuring up to 16.5 mm.  Given the patient's age and clinical history with the appearance of the endometrium, tumor not excluded.  This will require follow-up.  Original Report Authenticated By: Fuller Canada, M.D.   ED Course: discussed with Dr. Penne Lash  Procedures: Dr. Penne Lash here to evaluate the patient and will do endometrial biopsy today.  Assessment: Abnormal vaginal bleeding   Enlarged uterus   Anemia  Diff  Dx: Endometrial cancer   Adenomyosis  Plan:  I discussed with the patient in detail need for follow up to discuss results of biopsy, labs and plan of care   Depo Provera 150 mg IM now   Patient voices understanding I have reviewed this patient's vital signs, nurses notes, appropriate labs and imaging. Dr. Penne Lash has done an endometrial biopsy and discussed in detail with the patient diff. Dx and need for follow up.         MDM

## 2011-12-29 NOTE — MAU Note (Signed)
Endometrial specimen to pathology.

## 2011-12-29 NOTE — MAU Note (Signed)
Patient states she has been bleeding for 6 weeks, started with spotting and has become heavier like a regular period. States last night bled so heavy she had to change her bed and again this morning had a gush with multiple clots. Has a little pain off an on, not now.

## 2012-01-03 NOTE — MAU Provider Note (Signed)
ENDOMETRIAL BIOPSY     The indications for endometrial biopsy were reviewed.   Risks of the biopsy including cramping, bleeding, infection, uterine perforation, inadequate specimen and need for additional procedures  were discussed. The patient states she understands and agrees to undergo procedure today. Consent was signed. Time out was performed. Urine HCG was negative. A sterile speculum was placed in the patient's vagina and the cervix was prepped with Betadine. A single-toothed tenaculum was placed on the anterior lip of the cervix to stabilize it. The 3 mm pipelle was introduced into the endometrial cavity without difficulty to a depth of 9 cm, and a moderate amount of tissue was obtained and sent to pathology. The instruments were removed from the patient's vagina. Minimal bleeding from the cervix was noted. The patient tolerated the procedure well. Routine post-procedure instructions were given to the patient. The patient will follow up to review the results and for further management.    Pt has appt in gyn clinic.

## 2012-01-09 ENCOUNTER — Ambulatory Visit (INDEPENDENT_AMBULATORY_CARE_PROVIDER_SITE_OTHER): Payer: Self-pay | Admitting: Obstetrics & Gynecology

## 2012-01-09 ENCOUNTER — Encounter: Payer: Self-pay | Admitting: Obstetrics & Gynecology

## 2012-01-09 VITALS — BP 135/91 | HR 81 | Temp 97.5°F | Ht 61.0 in | Wt 144.5 lb

## 2012-01-09 DIAGNOSIS — Z01419 Encounter for gynecological examination (general) (routine) without abnormal findings: Secondary | ICD-10-CM

## 2012-01-09 DIAGNOSIS — Z0000 Encounter for general adult medical examination without abnormal findings: Secondary | ICD-10-CM

## 2012-01-09 DIAGNOSIS — N938 Other specified abnormal uterine and vaginal bleeding: Secondary | ICD-10-CM

## 2012-01-09 DIAGNOSIS — N949 Unspecified condition associated with female genital organs and menstrual cycle: Secondary | ICD-10-CM

## 2012-01-09 MED ORDER — MEGESTROL ACETATE 40 MG PO TABS: 40.0000 mg | ORAL_TABLET | Freq: Every day | ORAL | Status: DC

## 2012-01-09 NOTE — Patient Instructions (Signed)
Abnormal Uterine Bleeding Abnormal uterine bleeding can have many causes. Some cases are simply treated, while others are more serious. There are several kinds of bleeding that is considered abnormal, including:  Bleeding between periods.   Bleeding after sexual intercourse.   Spotting anytime in the menstrual cycle.   Bleeding heavier or more than normal.   Bleeding after menopause.  CAUSES  There are many causes of abnormal uterine bleeding. It can be present in teenagers, pregnant women, women during their reproductive years, and women who have reached menopause. Your caregiver will look for the more common causes depending on your age, signs, symptoms and your particular circumstance. Most cases are not serious and can be treated. Even the more serious causes, like cancer of the female organs, can be treated adequately if found in the early stages. That is why all types of bleeding should be evaluated and treated as soon as possible. DIAGNOSIS  Diagnosing the cause may take several kinds of tests. Your caregiver may:  Take a complete history of the type of bleeding.   Perform a complete physical exam and Pap smear.   Take an ultrasound on the abdomen showing a picture of the female organs and the pelvis.   Inject dye into the uterus and Fallopian tubes and X-ray them (hysterosalpingogram).   Place fluid in the uterus and do an ultrasound (sonohysterogrqphy).   Take a CT scan to examine the female organs and pelvis.   Take an MRI to examine the female organs and pelvis. There is no X-ray involved with this procedure.   Look inside the uterus with a telescope that has a light at the end (hysteroscopy).   Scrap the inside of the uterus to get tissue to examine (Dilatation and Curettage, D&C).   Look into the pelvis with a telescope that has a light at the end (laparoscopy). This is done through a very small cut (incision) in the abdomen.  TREATMENT  Treatment will depend on the  cause of the abnormal bleeding. It can include:  Doing nothing to allow the problem to take care of itself over time.   Hormone treatment.   Birth control pills.   Treating the medical condition causing the problem.   Laparoscopy.   Major or minor surgery   Destroying the lining of the uterus with electrical currant, laser, freezing or heat (uterine ablation).  HOME CARE INSTRUCTIONS   Follow your caregiver's recommendation on how to treat your problem.   See your caregiver if you missed a menstrual period and think you may be pregnant.   If you are bleeding heavily, count the number of pads/tampons you use and how often you have to change them. Tell this to your caregiver.   Avoid sexual intercourse until the problem is controlled.  SEEK MEDICAL CARE IF:   You have any kind of abnormal bleeding mentioned above.   You feel dizzy at times.   You are 56 years old and have not had a menstrual period yet.  SEEK IMMEDIATE MEDICAL CARE IF:   You pass out.   You are changing pads/tampons every 15 to 30 minutes.   You have belly (abdominal) pain.   You have a temperature of 100 F (37.8 C) or higher.   You become sweaty or weak.   You are passing large blood clots from the vagina.   You start to feel sick to your stomach (nauseous) and throw up (vomit).  Document Released: 06/13/2005 Document Revised: 06/02/2011 Document Reviewed: 11/06/2008 [employer]   Patient Information 2012 [employer]. 

## 2012-01-09 NOTE — Progress Notes (Signed)
Pt with PMP bleeding who had endo bx July 4th in MAU.  The bx showed atypical hyperplasia with polyps.  Pt has had D&C x2 and has tried meds however  She stopped meds after 2-3 days.  Pt with no pain.  PE: Lungs: clear CV:  RRR Abd:  Soft, NT/ND GU  Uterus enlarged to 14weeks; good decensus.  No adnexal masses  A/P PMPB.  D/w pt surgery options including TVH.  Pt wants to complete paperwork for financial aid  Megace 40mg  q day. F/u 4 weeks  HARRAWAY-SMITH, Debar Plate

## 2012-01-18 IMAGING — US US TRANSVAGINAL NON-OB
1 series · 14 of 25 positions shown · non-contrast
Comparison: 08/18/2009ultrasound, pelvic MRI 08/11/2005

CLINICAL DATA: Pelvic pain and menorrhagia

TRANSABDOMINAL AND TRANSVAGINAL ULTRASOUND OF PELVIS
TECHNIQUE: Both transabdominal and transvaginal ultrasound
examinations of the pelvis were performed. Transabdominal technique
was performed for global imaging of the pelvis including uterus,
ovaries, adnexal regions, and pelvic cul-de-sac.

[Series 1: us pelvis complete · 14 of 58 slices shown]
[im 1/58]
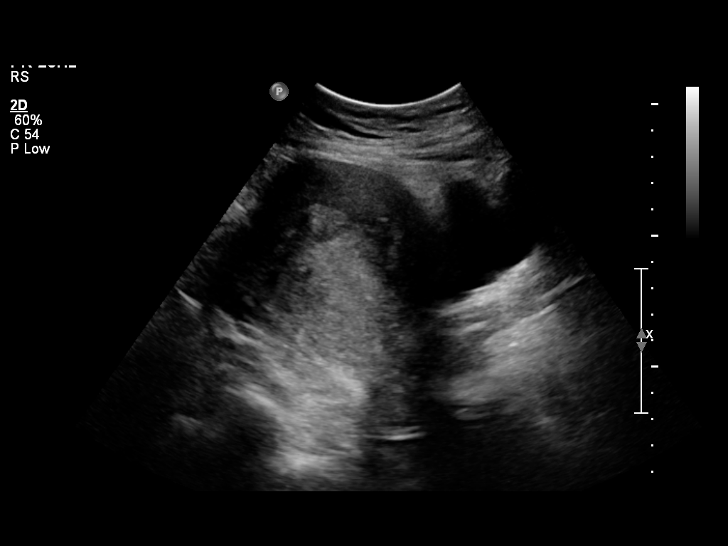
[im 5/58]
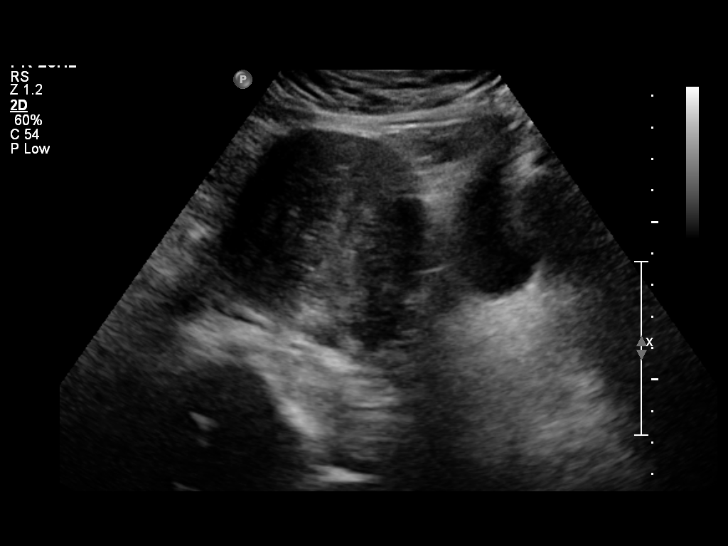
[im 10/58]
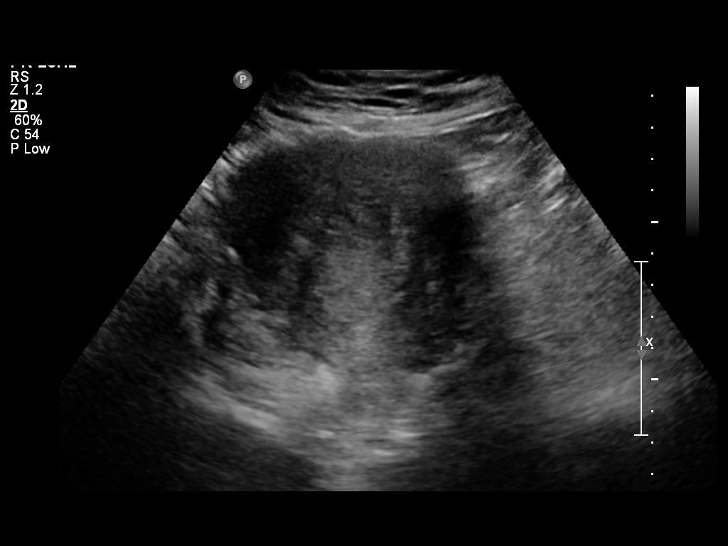
[im 15/58]
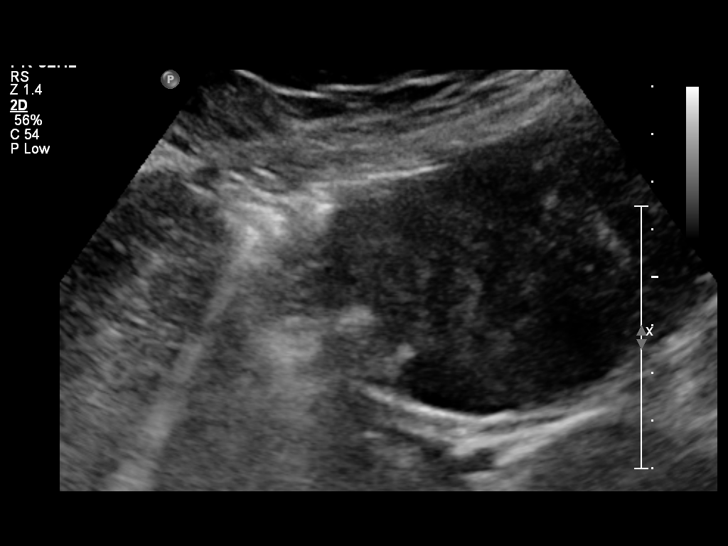
[im 20/58]
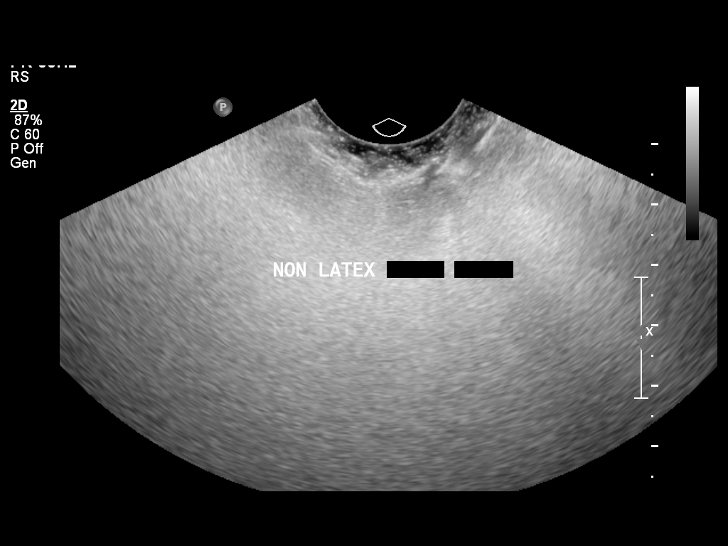
[im 22/58]
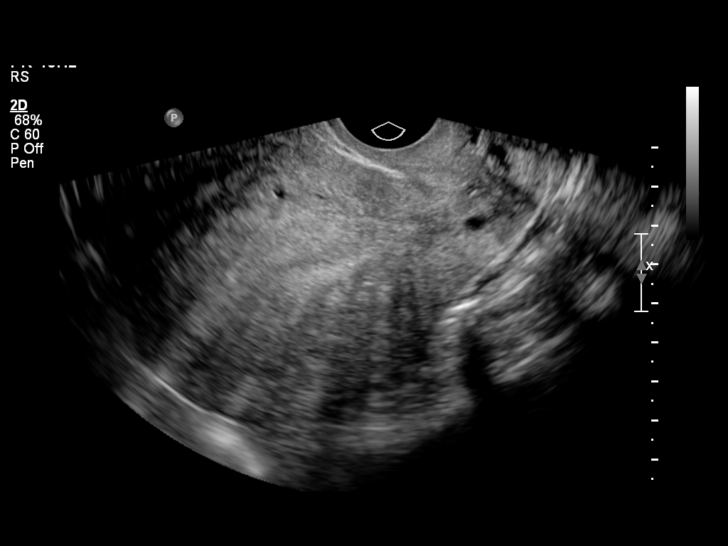
[im 27/58]
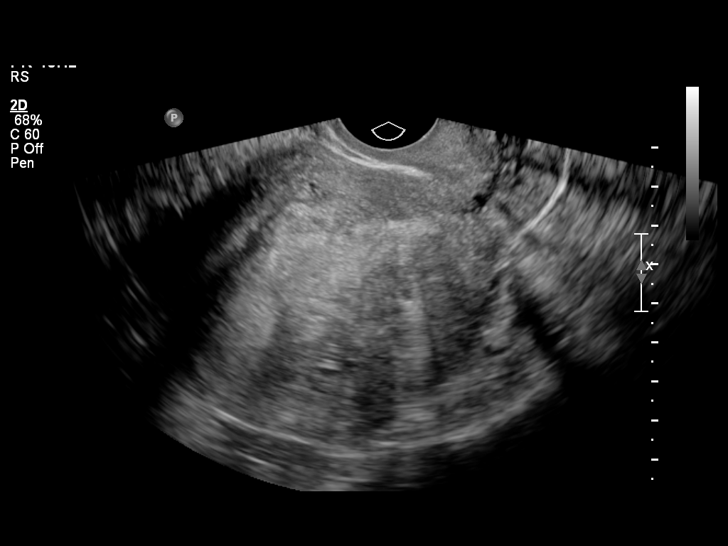
[im 31/58]
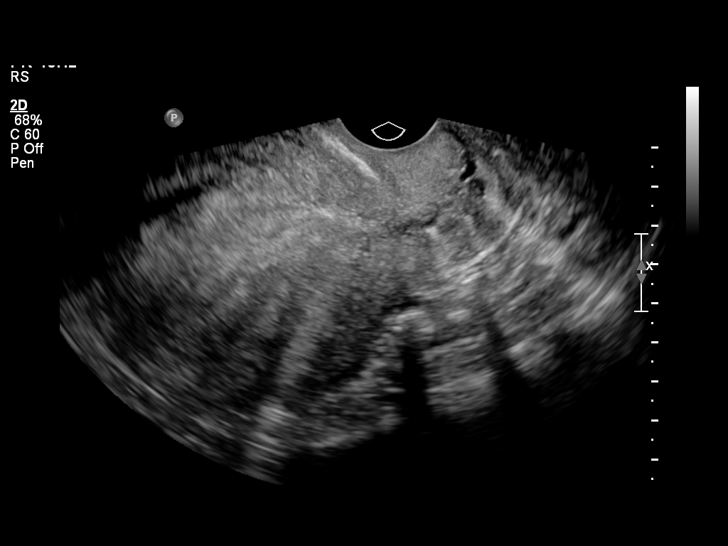
[im 36/58]
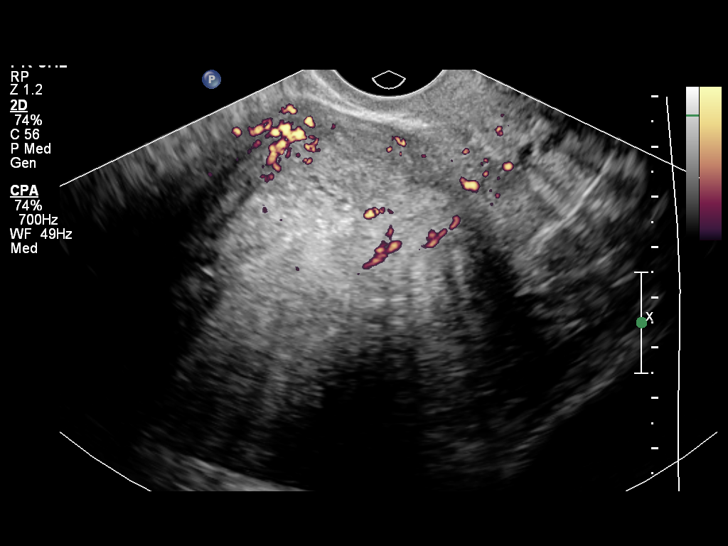
[im 39/58]
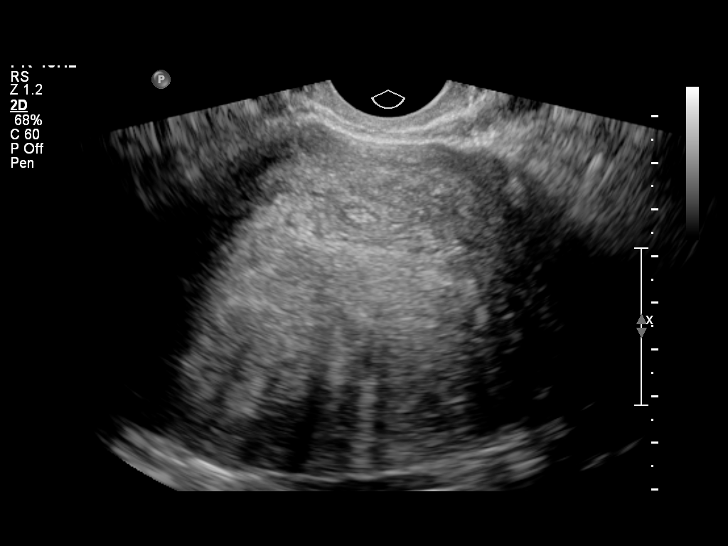
[im 43/58]
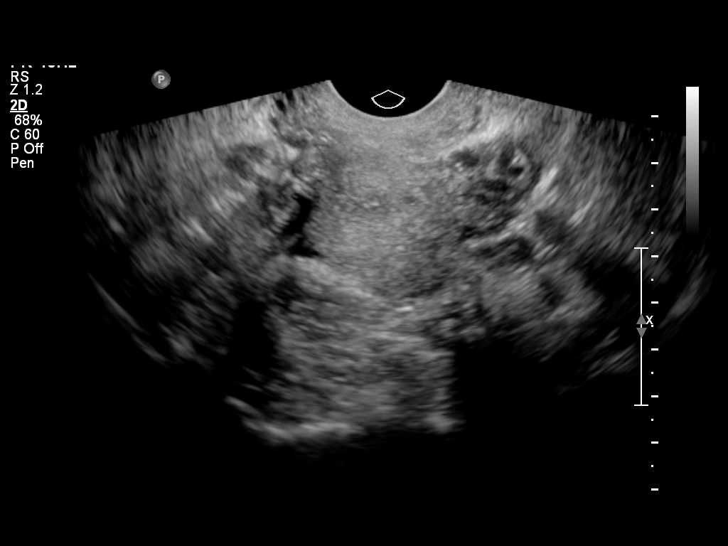
[im 48/58]
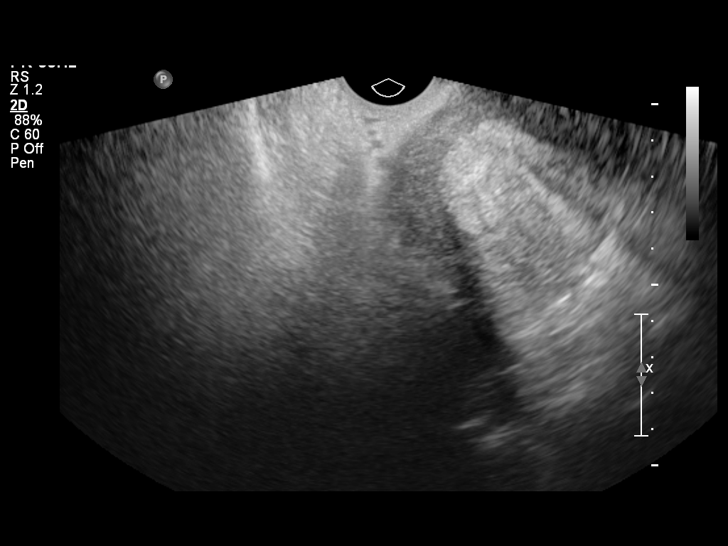
[im 53/58]
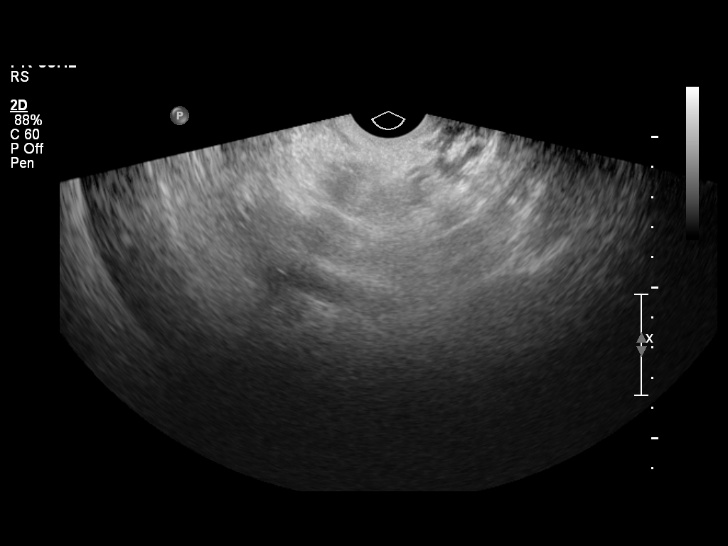
[im 58/58]
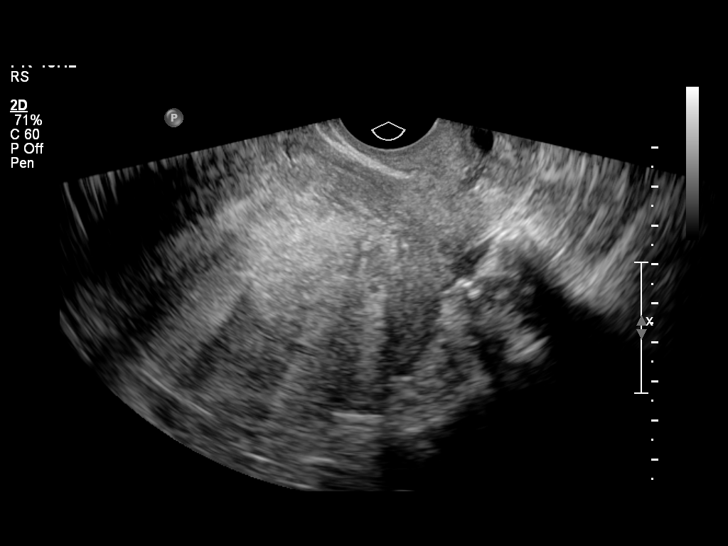

[14 of 25 positions shown; findings below may reference images not displayed]

It was necessary to proceed with endovaginal exam following the
transabdominal exam to visualize the ovaries, neither seen
transabdominally.
FINDINGS: Uterus: 12.3 x 9.0 x 6.5 cm.  Globally enlarged and inhomogeneous
appearing myometrium without focal measurable lesion.

Endometrium: 14 mm.  Inhomogeneously echogenic without focal
measurable abnormality.

Right ovary:  Not visualized.  No adnexal mass.

Left ovary: Not visualized.  No adnexal mass.

Other findings: No free fluid
IMPRESSION: Global persistent uterine enlargement without focal measurable
mass, which may represent adenomyosis as seen on prior studies.

## 2012-02-06 ENCOUNTER — Ambulatory Visit (INDEPENDENT_AMBULATORY_CARE_PROVIDER_SITE_OTHER): Payer: Self-pay | Admitting: Obstetrics & Gynecology

## 2012-02-06 ENCOUNTER — Encounter: Payer: Self-pay | Admitting: Advanced Practice Midwife

## 2012-02-06 VITALS — BP 125/78 | HR 76 | Temp 98.1°F | Ht 61.0 in | Wt 141.8 lb

## 2012-02-06 DIAGNOSIS — N938 Other specified abnormal uterine and vaginal bleeding: Secondary | ICD-10-CM

## 2012-02-06 DIAGNOSIS — N920 Excessive and frequent menstruation with regular cycle: Secondary | ICD-10-CM

## 2012-02-06 DIAGNOSIS — N949 Unspecified condition associated with female genital organs and menstrual cycle: Secondary | ICD-10-CM

## 2012-02-06 DIAGNOSIS — N921 Excessive and frequent menstruation with irregular cycle: Secondary | ICD-10-CM

## 2012-02-06 LAB — CBC
HCT: 27.1 % — ABNORMAL LOW (ref 36.0–46.0)
Hemoglobin: 8.1 g/dL — ABNORMAL LOW (ref 12.0–15.0)
MCH: 23.5 pg — ABNORMAL LOW (ref 26.0–34.0)
MCV: 78.8 fL (ref 78.0–100.0)
RBC: 3.44 MIL/uL — ABNORMAL LOW (ref 3.87–5.11)
WBC: 7.4 10*3/uL (ref 4.0–10.5)

## 2012-02-06 MED ORDER — MEGESTROL ACETATE 40 MG PO TABS: 40.0000 mg | ORAL_TABLET | Freq: Three times a day (TID) | ORAL | Status: AC

## 2012-02-06 NOTE — Progress Notes (Signed)
Pt on Megace for 2 weeks.  Reports that for the first week her bleeding improved then it began heavy again.  She continued the Megace 1 additional week with no change in the bleeding. Pt wants definitive treatment with surgery.  Filled out all of her paperwork end of July.  A/P Postmenopausal bleeding.  Reviewed with pt her sx.  Will increase Megace while waiting for the paperwork to return.  Megace 40mg  tid F/u 2-3 weeks Schedule TVH when paperwork returns  [place] L. Harraway-Smith, M.D., Evern Core

## 2012-02-06 NOTE — Patient Instructions (Addendum)
Floradix at [employer] or [employer]. Menorrhagia Dysfunctional uterine bleeding is different from a normal menstrual period. When periods are heavy or there is more bleeding than is usual for you, it is called menorrhagia. It may be caused by hormonal imbalance, or physical, metabolic, or other problems. Examination is necessary in order that your caregiver may treat treatable causes. If this is a continuing problem, a D&C may be needed. That means that the cervix (the opening of the uterus or womb) is dilated (stretched larger) and the lining of the uterus is scraped out. The tissue scraped out is then examined under a microscope by a specialist (pathologist) to make sure there is nothing of concern that needs further or more extensive treatment. HOME CARE INSTRUCTIONS   If medications were prescribed, take exactly as directed. Do not change or switch medications without consulting your caregiver.   Long term heavy bleeding may result in iron deficiency. Your caregiver may have prescribed iron pills. They help replace the iron your body lost from heavy bleeding. Take exactly as directed. Iron may cause constipation. If this becomes a problem, increase the bran, fruits, and roughage in your diet.   Do not take aspirin or medicines that contain aspirin one week before or during your menstrual period. Aspirin may make the bleeding worse.   If you need to change your sanitary pad or tampon more than once every 2 hours, stay in bed and rest as much as possible until the bleeding stops.   Eat well-balanced meals. Eat foods high in iron. Examples are leafy green vegetables, meat, liver, eggs, and whole grain breads and cereals. Do not try to lose weight until the abnormal bleeding has stopped and your blood iron level is back to normal.  SEEK MEDICAL CARE IF:   You need to change your sanitary pad or tampon more than once an hour.   You develop nausea (feeling sick to your stomach) and vomiting,  dizziness, or diarrhea while you are taking your medicine.   You have any problems that may be related to the medicine you are taking.  SEEK IMMEDIATE MEDICAL CARE IF:   You have a fever.   You develop chills.   You develop severe bleeding or start to pass blood clots.   You feel dizzy or faint.  MAKE SURE YOU:   Understand these instructions.   Will watch your condition.   Will get help right away if you are not doing well or get worse.  Document Released: 06/13/2005 Document Revised: 06/02/2011 Document Reviewed: 02/01/2008 [facility] Patient Information 2012 [place], [place].

## 2012-02-24 ENCOUNTER — Ambulatory Visit (INDEPENDENT_AMBULATORY_CARE_PROVIDER_SITE_OTHER): Payer: Self-pay | Admitting: Obstetrics & Gynecology

## 2012-02-24 ENCOUNTER — Encounter: Payer: Self-pay | Admitting: Obstetrics & Gynecology

## 2012-02-24 VITALS — BP 121/82 | HR 82 | Temp 98.2°F | Wt 144.9 lb

## 2012-02-24 DIAGNOSIS — N949 Unspecified condition associated with female genital organs and menstrual cycle: Secondary | ICD-10-CM

## 2012-02-24 DIAGNOSIS — N938 Other specified abnormal uterine and vaginal bleeding: Secondary | ICD-10-CM

## 2012-02-24 MED ORDER — FERROUS SULFATE 220 (44 FE) MG/5ML PO ELIX: 220.0000 mg | ORAL_SOLUTION | Freq: Every day | ORAL | Status: DC

## 2012-02-24 NOTE — Progress Notes (Signed)
States megace is helping with controlling the bleeding, but still spots every day. Has turned in financial paperwork one month ago, still waiting to hear so she can have surgery

## 2012-02-24 NOTE — Progress Notes (Signed)
Patient ID: Nancy Fields, female   DOB: 02-04-1956, 56 y.o.   MRN: [id number] .Patient's last menstrual period was 02/24/2012. Spotting only on Megace, will restart iron for anemia.  CBC    Component Value Date/Time   WBC 7.4 02/06/2012 1538   RBC 3.44* 02/06/2012 1538   HGB 8.1* 02/06/2012 1538   HCT 27.1* 02/06/2012 1538   PLT 400 02/06/2012 1538   MCV 78.8 02/06/2012 1538   MCH 23.5* 02/06/2012 1538   MCHC 29.9* 02/06/2012 1538   RDW 15.7* 02/06/2012 1538   LYMPHSABS 1.0 08/18/2009 1739   MONOABS 0.9 08/18/2009 1739   EOSABS 0.0 08/18/2009 1739   BASOSABS 0.0 08/18/2009 1739     She will return in about a month to schedule TVH.  [name] 02/24/2012 11:48 AM

## 2012-02-24 NOTE — Patient Instructions (Signed)
Abnormal Uterine Bleeding Abnormal uterine bleeding can have many causes. Some cases are simply treated, while others are more serious. There are several kinds of bleeding that is considered abnormal, including:  Bleeding between periods.   Bleeding after sexual intercourse.   Spotting anytime in the menstrual cycle.   Bleeding heavier or more than normal.   Bleeding after menopause.  CAUSES  There are many causes of abnormal uterine bleeding. It can be present in teenagers, pregnant women, women during their reproductive years, and women who have reached menopause. Your caregiver will look for the more common causes depending on your age, signs, symptoms and your particular circumstance. Most cases are not serious and can be treated. Even the more serious causes, like cancer of the female organs, can be treated adequately if found in the early stages. That is why all types of bleeding should be evaluated and treated as soon as possible. DIAGNOSIS  Diagnosing the cause may take several kinds of tests. Your caregiver may:  Take a complete history of the type of bleeding.   Perform a complete physical exam and Pap smear.   Take an ultrasound on the abdomen showing a picture of the female organs and the pelvis.   Inject dye into the uterus and Fallopian tubes and X-ray them (hysterosalpingogram).   Place fluid in the uterus and do an ultrasound (sonohysterogrqphy).   Take a CT scan to examine the female organs and pelvis.   Take an MRI to examine the female organs and pelvis. There is no X-ray involved with this procedure.   Look inside the uterus with a telescope that has a light at the end (hysteroscopy).   Scrap the inside of the uterus to get tissue to examine (Dilatation and Curettage, D&C).   Look into the pelvis with a telescope that has a light at the end (laparoscopy). This is done through a very small cut (incision) in the abdomen.  TREATMENT  Treatment will depend on the  cause of the abnormal bleeding. It can include:  Doing nothing to allow the problem to take care of itself over time.   Hormone treatment.   Birth control pills.   Treating the medical condition causing the problem.   Laparoscopy.   Major or minor surgery   Destroying the lining of the uterus with electrical currant, laser, freezing or heat (uterine ablation).  HOME CARE INSTRUCTIONS   Follow your caregiver's recommendation on how to treat your problem.   See your caregiver if you missed a menstrual period and think you may be pregnant.   If you are bleeding heavily, count the number of pads/tampons you use and how often you have to change them. Tell this to your caregiver.   Avoid sexual intercourse until the problem is controlled.  SEEK MEDICAL CARE IF:   You have any kind of abnormal bleeding mentioned above.   You feel dizzy at times.   You are 56 years old and have not had a menstrual period yet.  SEEK IMMEDIATE MEDICAL CARE IF:   You pass out.   You are changing pads/tampons every 15 to 30 minutes.   You have belly (abdominal) pain.   You have a temperature of 100 F (37.8 C) or higher.   You become sweaty or weak.   You are passing large blood clots from the vagina.   You start to feel sick to your stomach (nauseous) and throw up (vomit).  Document Released: 06/13/2005 Document Revised: 06/02/2011 Document Reviewed: 11/06/2008 [employer]   Patient Information 2012 [employer]. 

## 2012-03-26 ENCOUNTER — Ambulatory Visit (INDEPENDENT_AMBULATORY_CARE_PROVIDER_SITE_OTHER): Payer: Self-pay | Admitting: Obstetrics & Gynecology

## 2012-03-26 ENCOUNTER — Encounter: Payer: Self-pay | Admitting: Obstetrics & Gynecology

## 2012-03-26 VITALS — BP 107/81 | HR 65 | Temp 96.5°F | Ht 61.0 in | Wt 146.0 lb

## 2012-03-26 DIAGNOSIS — L719 Rosacea, unspecified: Secondary | ICD-10-CM

## 2012-03-26 DIAGNOSIS — N949 Unspecified condition associated with female genital organs and menstrual cycle: Secondary | ICD-10-CM

## 2012-03-26 DIAGNOSIS — N938 Other specified abnormal uterine and vaginal bleeding: Secondary | ICD-10-CM

## 2012-03-26 MED ORDER — MEGESTROL ACETATE 40 MG PO TABS: 40.0000 mg | ORAL_TABLET | Freq: Two times a day (BID) | ORAL | Status: DC

## 2012-03-26 MED ORDER — DOXYCYCLINE HYCLATE 50 MG PO CAPS: 50.0000 mg | ORAL_CAPSULE | ORAL | Status: DC

## 2012-03-26 NOTE — Patient Instructions (Signed)

## 2012-03-26 NOTE — Progress Notes (Signed)
Subjective:     Patient ID: Nancy Fields, female   DOB: [birth year], 56 y.o.   MRN: [id number]  HPI Pt reports that she is still awaiting her financial aid paperwork and is running out of her meds.  She still wants a TVH as definitive treatment.  C/o continued spotting on        Review of Systems     Objective:   Physical ExamBP 107/81  Pulse 65  Temp 96.5 F (35.8 C) (Oral)  Ht 5\' 1"  (1.549 m)  Wt 146 lb (66.225 kg)  BMI 27.59 kg/m2  HEENT: cheeks are bright red bilaterally.  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 Harraway-Smith, M.D., Evern Core

## 2012-04-09 ENCOUNTER — Encounter: Payer: Self-pay | Admitting: Family Medicine

## 2012-04-09 ENCOUNTER — Ambulatory Visit (INDEPENDENT_AMBULATORY_CARE_PROVIDER_SITE_OTHER): Payer: Self-pay | Admitting: Family Medicine

## 2012-04-09 VITALS — BP 133/89 | HR 62 | Ht 61.0 in | Wt 148.0 lb

## 2012-04-09 DIAGNOSIS — Z9119 Patient's noncompliance with other medical treatment and regimen: Secondary | ICD-10-CM

## 2012-04-09 DIAGNOSIS — D500 Iron deficiency anemia secondary to blood loss (chronic): Secondary | ICD-10-CM

## 2012-04-09 DIAGNOSIS — N8502 Endometrial intraepithelial neoplasia [EIN]: Secondary | ICD-10-CM

## 2012-04-09 DIAGNOSIS — R011 Cardiac murmur, unspecified: Secondary | ICD-10-CM

## 2012-04-09 NOTE — Patient Instructions (Addendum)
Hysterectomy Information   A hysterectomy is a procedure where your uterus is surgically removed. It will no longer be possible to have menstrual periods or to become pregnant. The tubes and ovaries can be removed (bilateral salpingo-oopherectomy) during this surgery as well.    REASONS FOR A HYSTERECTOMY  · Persistent, abnormal bleeding.  · Lasting (chronic) pelvic pain or infection.  · The lining of the uterus (endometrium) starts growing outside the uterus (endometriosis).  · The endometrium starts growing in the muscle of the uterus (adenomyosis).  · The uterus falls down into the vagina (pelvic organ prolapse).  · Symptomatic uterine fibroids.  · Precancerous cells.  · Cervical cancer or uterine cancer.  TYPES OF HYSTERECTOMIES  · Supracervical hysterectomy. This type removes the top part of the uterus, but not the cervix.  · Total hysterectomy. This type removes the uterus and cervix.  · Radical hysterectomy. This type removes the uterus, cervix, and the fibrous tissue that holds the uterus in place in the pelvis (parametrium).  WAYS A HYSTERECTOMY CAN BE PERFORMED  · Abdominal hysterectomy. A large surgical cut (incision) is made in the abdomen. The uterus is removed through this incision.  · Vaginal hysterectomy. An incision is made in the vagina. The uterus is removed through this incision. There are no abdominal incisions.  · Conventional laparoscopic hysterectomy. A thin, lighted tube with a camera (laparoscope) is inserted into 3 or 4 small incisions in the abdomen. The uterus is cut into small pieces. The small pieces are removed through the incisions, or they are removed through the vagina.  · Laparoscopic assisted vaginal hysterectomy (LAVH). Three or four small incisions are made in the abdomen. Part of the surgery is performed laparoscopically and part vaginally. The uterus is removed through the vagina.  · Robot-assisted laparoscopic hysterectomy. A laparoscope is inserted into 3 or 4 small  incisions in the abdomen. A computer-controlled device is used to give the surgeon a 3D image. This allows for more precise movements of surgical instruments. The uterus is cut into small pieces and removed through the incisions or removed through the vagina.  RISKS OF HYSTERECTOMY    · Bleeding and risk of blood transfusion. Tell your caregiver if you do not want to receive any blood products.  · Blood clots in the legs or lung.  · Infection.  · Injury to surrounding organs.  · Anesthesia problems or side effects.  · Conversion to an abdominal hysterectomy.  WHAT TO EXPECT AFTER A HYSTERECTOMY  · You will be given pain medicine.  · You will need to have someone with you for the first 3 to 5 days after you go home.  · You will need to follow up with your surgeon in 2 to 4 weeks after surgery to evaluate your progress.  · You may have early menopause symptoms like hot flashes, night sweats, and insomnia.  · If you had a hysterectomy for a problem that was not a cancer or a condition that could lead to cancer, then you no longer need Pap tests. However, even if you no longer need a Pap test, a regular exam is a good idea to make sure no other problems are starting.  Document Released: 12/07/2000 Document Revised: 09/05/2011 Document Reviewed: 01/22/2011  ExitCare® Patient Information ©2013 [employer].

## 2012-04-10 DIAGNOSIS — D500 Iron deficiency anemia secondary to blood loss (chronic): Secondary | ICD-10-CM | POA: Insufficient documentation

## 2012-04-10 DIAGNOSIS — I350 Nonrheumatic aortic (valve) stenosis: Secondary | ICD-10-CM | POA: Insufficient documentation

## 2012-04-10 DIAGNOSIS — Z9119 Patient's noncompliance with other medical treatment and regimen: Secondary | ICD-10-CM | POA: Insufficient documentation

## 2012-04-10 DIAGNOSIS — N8502 Endometrial intraepithelial neoplasia [EIN]: Secondary | ICD-10-CM | POA: Insufficient documentation

## 2012-04-10 NOTE — Assessment & Plan Note (Signed)
Referral to Cards prior to surgery.  Pt. Reports no prior history of having murmur.

## 2012-04-10 NOTE — Assessment & Plan Note (Signed)
Long running.  Had nml path in 2007, but had this path in 2011.  Pt. Failed to return for further treatment.  Definitive treatment is hysterectomy.  Risks and benefits were discussed.  Pt. With 2 prev. SVD's and a good candidate for TVH.  Add BSO after cardiac clearance.

## 2012-04-10 NOTE — Progress Notes (Signed)
  Subjective:    Patient ID: Nancy Fields, female    DOB: [birth year], 56 y.o.   MRN: [id number]  HPI Pt. Has long h/o abnormal uterine bleeding.  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  The patient failed to return for treatment since her bleeding improved following the D & C.  She now has had several more episodes of bleeding and underwent endometrial sampling in [facility] on 12/29/11 which again revealed complex atypical hyperplasia originating in a polyp.  She has been seen in GYN clinic and advised appropriately to have TVH.  She, then, decided to change surgeons and is here today to see me.  I performed her previous D & C, and she would like me to perform her hysterectomy.  Her bleeding is currently controlled on Megace twice daily.  She is likely menopausal, although she continues to have bleeding.  She is reporting some chest pain.  Her last Hgb was 8.1.   Review of Systems  Constitutional: Positive for fatigue. Negative for fever.  Cardiovascular: Positive for chest pain.  Gastrointestinal: Negative for abdominal pain. Rectal pain: hemorrhoids.  Genitourinary: Positive for vaginal bleeding. Negative for dysuria.  Skin: Positive for rash (rosacea).  Neurological: Negative for headaches.       Objective:   Physical Exam  Vitals reviewed. Constitutional: She is oriented to person, place, and time. She appears well-developed and well-nourished.  HENT:  Head: Normocephalic and atraumatic.  Eyes: No scleral icterus.  Neck: Neck supple.  Cardiovascular: Normal rate and regular rhythm.   Murmur (2-3/6 SEM) heard. Pulmonary/Chest: Effort normal and breath sounds normal.  Abdominal: Soft. She exhibits no mass. There is no tenderness.  Musculoskeletal: Normal range of motion.  Neurological: She is alert and oriented to person, place, and time.  Skin: Skin is warm. Rash: acneiform on cheeks.  Psychiatric: She has a normal mood and affect.          Assessment & Plan:

## 2012-04-18 ENCOUNTER — Inpatient Hospital Stay: Admit: 2012-04-18 | Payer: Self-pay | Admitting: Obstetrics & Gynecology

## 2012-04-18 SURGERY — HYSTERECTOMY, VAGINAL
Anesthesia: Choice | Site: Vagina

## 2012-05-01 ENCOUNTER — Ambulatory Visit (INDEPENDENT_AMBULATORY_CARE_PROVIDER_SITE_OTHER): Payer: Self-pay | Admitting: Cardiovascular Disease

## 2012-05-01 ENCOUNTER — Encounter: Payer: Self-pay | Admitting: Cardiovascular Disease

## 2012-05-01 VITALS — BP 134/90 | HR 66 | Ht 61.0 in | Wt 150.8 lb

## 2012-05-01 DIAGNOSIS — Z01818 Encounter for other preprocedural examination: Secondary | ICD-10-CM

## 2012-05-01 DIAGNOSIS — R011 Cardiac murmur, unspecified: Secondary | ICD-10-CM

## 2012-05-01 NOTE — Patient Instructions (Addendum)
Your physician has requested that you have an echocardiogram. Echocardiography is a painless test that uses sound waves to create images of your heart. It provides your doctor with information about the size and shape of your heart and how well your heart's chambers and valves are working. This procedure takes approximately one hour. There are no restrictions for this procedure.  Your physician recommends that you continue on your current medications as directed. Please refer to the Current Medication list given to you today.  Your physician recommends that you schedule a follow-up appointment in: as needed basis

## 2012-05-01 NOTE — Progress Notes (Signed)
    Nancy Fields Date of Birth  Nov 01, 1955       [facility]    [place] [address]  [address] [place], [place]  [address]   [place], [place]  [address] [phone]     [phone]   Fax  [phone]    Fax [phone]  Problem List: 1. Heart murmur 2. Rosasia 3, Vaginal bleeding  History of Present Illness:  Nancy Fields is a 56 yo with hx of vaginal bleeding for the past several years.   She was told that she had a heart murmur and was sent for further evaluation.  She has rare sharp shooting pains ( knife like).  She does not get any regular exercise.  Current Outpatient Prescriptions on File Prior to Visit  Medication Sig Dispense Refill  . doxycycline (VIBRAMYCIN) 50 MG capsule Take 1 capsule (50 mg total) by mouth 1 day or 1 dose.  30 capsule  1  . ferrous sulfate 325 (65 FE) MG tablet Take 325 mg by mouth daily with breakfast.      . megestrol (MEGACE) 40 MG tablet Take 1 tablet (40 mg total) by mouth 2 (two) times daily.  60 tablet  1    Allergies  Allergen Reactions  . Penicillins Hives  . Latex Rash    Past Medical History  Diagnosis Date  . Abnormal vaginal bleeding   . Rosacea   . Cataract     bilateral eyes x 5 years ago.    Past Surgical History  Procedure Date  . Dilation and curettage of uterus   . Abcess drainage   . Appendectomy   . Bilateral tubal ligation     History  Smoking status  . Never Smoker   Smokeless tobacco  . Never Used    History  Alcohol Use No    Comment: social    Family History  Problem Relation Age of Onset  . Cancer Mother 73    lung cancer  . Stroke Father   . Cancer Brother     skin cancer  . Cancer Maternal Aunt     stomach  . Stroke Maternal Grandmother   . Cancer Maternal Grandfather     colon    Reviw of Systems:  Reviewed in the HPI.  All other systems are negative.  Physical Exam: Blood pressure 134/90, pulse 66, height 5\' 1"  (1.549 m), weight 150  lb 12.8 oz (68.402 kg). General: Well developed, well nourished, in no acute distress.  Head: Normocephalic, atraumatic, sclera non-icteric, mucus membranes are moist,   Neck: Supple. Carotids are 2 + without bruits. No JVD  Lungs: Clear bilaterally to auscultation.  Heart: regular rate.  normal  S1 S2. No murmurs, gallops or rubs.  Abdomen: Soft, non-tender, non-distended with normal bowel sounds. No hepatomegaly. No rebound/guarding. No masses.  Msk:  Strength and tone are normal  Extremities: No clubbing or cyanosis. No edema.  Distal pedal pulses are 2+ and equal bilaterally.  Neuro: Alert and oriented X 3. Moves all extremities spontaneously.  Psych:  Responds to questions appropriately with a normal affect.  ECG: Nov. 5, 2013 - NSR at 61.  NS ST /T wave changes.  Assessment / Plan:

## 2012-05-01 NOTE — Assessment & Plan Note (Signed)
Nancy Fields is a 56 yo with a hx of vaginal bleeding who needs a hysterectomy.  She was found to have a heart murmur.  The murmur is a systolic murmur and could be due to mild aortic sclerosis in the setting of anemia.  We will get an echocardiogram for further evaluation.  She has some mild nonspecific ECG abnormalities but no symptoms of CP or dyspnea.    If her echo is non-critical, we will clear her for surgery.  She needs to find a general medical doctor and we will see her on an as needed basis.

## 2012-05-08 ENCOUNTER — Ambulatory Visit (HOSPITAL_COMMUNITY): Payer: Self-pay

## 2012-05-08 DIAGNOSIS — I359 Nonrheumatic aortic valve disorder, unspecified: Secondary | ICD-10-CM | POA: Insufficient documentation

## 2012-05-08 DIAGNOSIS — R011 Cardiac murmur, unspecified: Secondary | ICD-10-CM | POA: Insufficient documentation

## 2012-05-08 DIAGNOSIS — Z01818 Encounter for other preprocedural examination: Secondary | ICD-10-CM | POA: Insufficient documentation

## 2012-05-08 DIAGNOSIS — I059 Rheumatic mitral valve disease, unspecified: Secondary | ICD-10-CM | POA: Insufficient documentation

## 2012-05-08 DIAGNOSIS — I369 Nonrheumatic tricuspid valve disorder, unspecified: Secondary | ICD-10-CM | POA: Insufficient documentation

## 2012-05-08 DIAGNOSIS — I517 Cardiomegaly: Secondary | ICD-10-CM | POA: Insufficient documentation

## 2012-05-08 NOTE — Progress Notes (Signed)
Echocardiogram performed.  

## 2012-05-10 ENCOUNTER — Telehealth: Payer: Self-pay | Admitting: *Deleted

## 2012-05-10 NOTE — Telephone Encounter (Signed)
I will route the note to [facility] with Dr Harvie Bridge advise.

## 2012-05-10 NOTE — Telephone Encounter (Signed)
Message copied by Antony Odea on Thu May 10, 2012  1:53 PM ------      Message from: Vesta Mixer      Created: Thu May 10, 2012  8:33 AM      Regarding: RE: Surgical Clearance       Ms. Goddu has mild - moderate aortic stenosis and should be at low risk for surgery from a cardiology standpoint.            I will see her on a yearly basis.            ----- Message -----         From: Juliette Mangle, RN         Sent: 05/09/2012  10:46 AM           To: Vesta Mixer, MD      Subject: Surgical Clearance                                       Could you tell me if this patient is cleared for surgery?      Thanks,      [facility]      [facility]       [phone]

## 2012-05-29 ENCOUNTER — Ambulatory Visit: Payer: Self-pay | Admitting: Family Medicine

## 2012-05-29 ENCOUNTER — Encounter: Payer: Self-pay | Admitting: Family Medicine

## 2012-05-29 ENCOUNTER — Ambulatory Visit (INDEPENDENT_AMBULATORY_CARE_PROVIDER_SITE_OTHER): Payer: Self-pay | Admitting: Family Medicine

## 2012-05-29 VITALS — BP 134/89 | HR 68 | Ht 61.0 in | Wt 149.0 lb

## 2012-05-29 DIAGNOSIS — N920 Excessive and frequent menstruation with regular cycle: Secondary | ICD-10-CM

## 2012-05-29 DIAGNOSIS — N8502 Endometrial intraepithelial neoplasia [EIN]: Secondary | ICD-10-CM

## 2012-05-29 NOTE — Progress Notes (Signed)
  Subjective:    Patient ID: Nancy Fields, female    DOB: [birth year], 56 y.o.   MRN: [id number]  HPI  Returns for pre-op eval.  Had heart murmur worked up and found to have aortic stenosis.  Cleared by Cards.  Last EMB was 7/13 and showed Complex Hyperplasia with atypia.  Continues to bleed.  Has had same dx x 3 years.  Worrisome for Cancer.  Booked for TVH/BSO 07/09/12.  Review of Systems  HENT: Negative for nosebleeds and congestion.   Respiratory: Negative for shortness of breath.   Cardiovascular: Negative for chest pain.  Genitourinary: Positive for vaginal bleeding.  Psychiatric/Behavioral: Negative for confusion and dysphoric mood.       Objective:   Physical Exam  Vitals reviewed. Constitutional: She appears well-developed and well-nourished.  HENT:  Head: Normocephalic and atraumatic.  Eyes: No scleral icterus.  Neck: Neck supple.  Cardiovascular: Normal rate and regular rhythm.   Pulmonary/Chest: Effort normal.  Abdominal: Soft. There is no tenderness.  Genitourinary: Vagina normal and uterus normal.   Procedure: Patient given informed consent, signed copy in the chart, time out was performed. Appropriate time out taken. . The patient was placed in the lithotomy position and the cervix brought into view with sterile speculum.  Portio of cervix cleansed x 2 with betadine swabs.  A tenaculum was placed in the anterior lip of the cervix.  The uterus was sounded for depth of 7.5cm. A pipelle was introduced to into the uterus, suction created,  and an endometrial sample was obtained x 3, with mostly blood. All equipment was removed and accounted for.  The patient tolerated the procedure well.          Assessment & Plan:

## 2012-05-29 NOTE — Assessment & Plan Note (Addendum)
Have reviewed at length with pt., why she needs hysterectomy, why we are taking the approach we are taking, her diagnosis, about polypectomy and pt is Given information about diagnosis and reason for hysterectomy, alternatives discussed.  I am concerned about possible endometrial cancer that we haven't found in the office sampling.  Last real D & C was 3 yrs ago.  Repeat Endo Bx done today--if not much tissue will schedule for D and C prior to hysterectomy.

## 2012-05-29 NOTE — Patient Instructions (Signed)
Endometrial Biopsy This is a test in which a tissue sample (a biopsy) is taken from inside the uterus (womb). It is then looked at by a specialist under a microscope to see if the tissue is normal or abnormal. The endometrium is the lining of the uterus. This test helps determine where you are in your menstrual cycle and how hormone levels are affecting the lining of the uterus. Another use for this test is to diagnose endometrial cancer, tuberculosis, polyps, or inflammatory conditions and to evaluate uterine bleeding. PREPARATION FOR TEST No preparation or fasting is necessary. NORMAL FINDINGS No pathologic conditions. Presence of "secretory-type" endometrium 3 to 5 days before to normal menstruation. Ranges for normal findings may vary among different laboratories and hospitals. You should always check with your doctor after having lab work or other tests done to discuss the meaning of your test results and whether your values are considered within normal limits. MEANING OF TEST  Your caregiver will go over the test results with you and discuss the importance and meaning of your results, as well as treatment options and the need for additional tests if necessary. OBTAINING THE TEST RESULTS It is your responsibility to obtain your test results. Ask the lab or department performing the test when and how you will get your results. Document Released: 10/14/2004 Document Revised: 09/05/2011 Document Reviewed: 05/23/2008 Exit[employer] Patient Information 2013 [employer].  

## 2012-06-12 ENCOUNTER — Encounter (HOSPITAL_COMMUNITY): Payer: Self-pay | Admitting: Pharmacist

## 2012-06-18 ENCOUNTER — Encounter (HOSPITAL_COMMUNITY): Payer: Self-pay

## 2012-06-18 ENCOUNTER — Encounter (HOSPITAL_COMMUNITY)
Admission: RE | Admit: 2012-06-18 | Discharge: 2012-06-18 | Disposition: A | Discharge status: Home / Self Care | Payer: Self-pay | Source: Ambulatory Visit | Attending: Family Medicine | Admitting: Family Medicine

## 2012-06-18 HISTORY — DX: Cardiac murmur, unspecified: R01.1

## 2012-06-18 HISTORY — DX: Personal history of other diseases of the digestive system: Z87.19

## 2012-06-18 HISTORY — DX: Unspecified osteoarthritis, unspecified site: M19.90

## 2012-06-18 HISTORY — DX: Anemia, unspecified: D64.9

## 2012-06-18 LAB — CBC
MCH: 32.5 pg (ref 26.0–34.0)
MCHC: 33.9 g/dL (ref 30.0–36.0)
Platelets: 234 10*3/uL (ref 150–400)

## 2012-06-18 NOTE — Patient Instructions (Addendum)
   Your procedure is scheduled NW:GNFAOZ Decemberr 27th  Enter through the Main Entrance of [facility] at:12:30pm Pick up the phone at the desk and dial [phone] and inform us of your arrival.  Please call this number if you have any problems the morning of surgery: [phone]  Remember: Do not eat food after midnight on Thursday You may drink clear liquids until 10am then nothing Take these medicines the morning of surgery with a SIP OF WATER:  None  Do not wear jewelry, make-up, or FINGER nail polish No metal in your hair or on your body. Do not wear lotions, powders, perfumes. You may wear deodorant.  Please use your CHG wash as directed prior to surgery.  Do not shave anywhere for at least 12 hours prior to first CHG shower.  Do not bring valuables to the hospital. Contacts, dentures or bridgework may not be worn into surgery.  Patients discharged on the day of surgery will not be allowed to drive home.  Home with daughter Nancy Fields.

## 2012-06-21 MED ORDER — GENTAMICIN SULFATE 40 MG/ML IJ SOLN
INTRAVENOUS | Status: AC
  Administered 2012-06-22: 280 mL via INTRAVENOUS
  Filled 2012-06-21: qty 7

## 2012-06-22 ENCOUNTER — Encounter (HOSPITAL_COMMUNITY)
Admission: RE | Disposition: A | Discharge status: Home / Self Care | Payer: Self-pay | Source: Ambulatory Visit | Attending: Family Medicine

## 2012-06-22 ENCOUNTER — Encounter (HOSPITAL_COMMUNITY): Payer: Self-pay | Admitting: Anesthesiology

## 2012-06-22 ENCOUNTER — Ambulatory Visit (HOSPITAL_COMMUNITY): Payer: Self-pay | Admitting: Anesthesiology

## 2012-06-22 ENCOUNTER — Ambulatory Visit (HOSPITAL_COMMUNITY)
Admission: RE | Admit: 2012-06-22 | Discharge: 2012-06-22 | Disposition: A | Discharge status: Home / Self Care | Payer: Self-pay | Source: Ambulatory Visit | Attending: Family Medicine | Admitting: Family Medicine

## 2012-06-22 DIAGNOSIS — Z01818 Encounter for other preprocedural examination: Secondary | ICD-10-CM | POA: Insufficient documentation

## 2012-06-22 DIAGNOSIS — N8502 Endometrial intraepithelial neoplasia [EIN]: Secondary | ICD-10-CM | POA: Insufficient documentation

## 2012-06-22 DIAGNOSIS — Z01812 Encounter for preprocedural laboratory examination: Secondary | ICD-10-CM | POA: Insufficient documentation

## 2012-06-22 DIAGNOSIS — R011 Cardiac murmur, unspecified: Secondary | ICD-10-CM | POA: Insufficient documentation

## 2012-06-22 DIAGNOSIS — N950 Postmenopausal bleeding: Secondary | ICD-10-CM | POA: Insufficient documentation

## 2012-06-22 DIAGNOSIS — D649 Anemia, unspecified: Secondary | ICD-10-CM | POA: Insufficient documentation

## 2012-06-22 HISTORY — PX: DILATION AND CURETTAGE OF UTERUS: SHX78

## 2012-06-22 SURGERY — DILATION AND CURETTAGE
Anesthesia: General | Site: Uterus | Wound class: Clean Contaminated

## 2012-06-22 MED ORDER — LACTATED RINGERS IV SOLN: INTRAVENOUS | Status: DC

## 2012-06-22 MED ORDER — PROPOFOL 10 MG/ML IV EMUL
INTRAVENOUS | Status: DC | PRN
  Administered 2012-06-22: 130 mg via INTRAVENOUS

## 2012-06-22 MED ORDER — PHENYLEPHRINE 40 MCG/ML (10ML) SYRINGE FOR IV PUSH (FOR BLOOD PRESSURE SUPPORT)
PREFILLED_SYRINGE | INTRAVENOUS | Status: AC
  Filled 2012-06-22: qty 5

## 2012-06-22 MED ORDER — FENTANYL CITRATE 0.05 MG/ML IJ SOLN: 25.0000 ug | INTRAMUSCULAR | Status: DC | PRN

## 2012-06-22 MED ORDER — LIDOCAINE HCL (CARDIAC) 20 MG/ML IV SOLN
INTRAVENOUS | Status: AC
Start: 2012-06-22 — End: 2012-06-22
  Filled 2012-06-22: qty 5

## 2012-06-22 MED ORDER — MIDAZOLAM HCL 5 MG/5ML IJ SOLN
INTRAMUSCULAR | Status: DC | PRN
  Administered 2012-06-22: 1 mg via INTRAVENOUS

## 2012-06-22 MED ORDER — MEPERIDINE HCL 25 MG/ML IJ SOLN: 6.2500 mg | INTRAMUSCULAR | Status: DC | PRN

## 2012-06-22 MED ORDER — GLYCOPYRROLATE 0.2 MG/ML IJ SOLN
INTRAMUSCULAR | Status: DC | PRN
  Administered 2012-06-22: 0.3 mg via INTRAVENOUS

## 2012-06-22 MED ORDER — LIDOCAINE-EPINEPHRINE 1 %-1:100000 IJ SOLN
INTRAMUSCULAR | Status: DC | PRN
  Administered 2012-06-22: 20 mL

## 2012-06-22 MED ORDER — ONDANSETRON HCL 4 MG/2ML IJ SOLN
INTRAMUSCULAR | Status: AC
Start: 2012-06-22 — End: 2012-06-22
  Filled 2012-06-22: qty 2

## 2012-06-22 MED ORDER — PHENYLEPHRINE HCL 10 MG/ML IJ SOLN
INTRAMUSCULAR | Status: DC | PRN
  Administered 2012-06-22: 120 ug via INTRAVENOUS

## 2012-06-22 MED ORDER — PROPOFOL 10 MG/ML IV EMUL
INTRAVENOUS | Status: AC
  Filled 2012-06-22: qty 20

## 2012-06-22 MED ORDER — KETOROLAC TROMETHAMINE 30 MG/ML IJ SOLN: 15.0000 mg | Freq: Once | INTRAMUSCULAR | Status: DC | PRN

## 2012-06-22 MED ORDER — ONDANSETRON HCL 4 MG/2ML IJ SOLN
INTRAMUSCULAR | Status: DC | PRN
  Administered 2012-06-22: 4 mg via INTRAVENOUS

## 2012-06-22 MED ORDER — LACTATED RINGERS IV SOLN
INTRAVENOUS | Status: DC
  Administered 2012-06-22: 13:00:00 via INTRAVENOUS

## 2012-06-22 MED ORDER — ONDANSETRON HCL 4 MG/2ML IJ SOLN: 4.0000 mg | Freq: Once | INTRAMUSCULAR | Status: DC | PRN

## 2012-06-22 MED ORDER — GLYCOPYRROLATE 0.2 MG/ML IJ SOLN
INTRAMUSCULAR | Status: AC
Start: 2012-06-22 — End: 2012-06-22
  Filled 2012-06-22: qty 2

## 2012-06-22 MED ORDER — IBUPROFEN 600 MG PO TABS: 600.0000 mg | ORAL_TABLET | Freq: Four times a day (QID) | ORAL | Status: DC | PRN

## 2012-06-22 MED ORDER — FENTANYL CITRATE 0.05 MG/ML IJ SOLN
INTRAMUSCULAR | Status: AC
Start: 2012-06-22 — End: 2012-06-22
  Filled 2012-06-22: qty 2

## 2012-06-22 MED ORDER — FENTANYL CITRATE 0.05 MG/ML IJ SOLN
INTRAMUSCULAR | Status: DC | PRN
  Administered 2012-06-22 (×2): 50 ug via INTRAVENOUS

## 2012-06-22 MED ORDER — MIDAZOLAM HCL 2 MG/2ML IJ SOLN
INTRAMUSCULAR | Status: AC
  Filled 2012-06-22: qty 2

## 2012-06-22 MED ORDER — LIDOCAINE HCL (CARDIAC) 20 MG/ML IV SOLN
INTRAVENOUS | Status: DC | PRN
  Administered 2012-06-22: 20 mg via INTRAVENOUS
  Administered 2012-06-22: 30 mg via INTRAVENOUS

## 2012-06-22 SURGICAL SUPPLY — 19 items
CATH ROBINSON RED A/P 16FR (CATHETERS) ×2 IMPLANT
CLOTH BEACON ORANGE TIMEOUT ST (SAFETY) ×2 IMPLANT
CONTAINER PREFILL 10% NBF 60ML (FORM) ×4 IMPLANT
DECANTER SPIKE VIAL GLASS SM (MISCELLANEOUS) ×2 IMPLANT
DRESSING TELFA 8X3 (GAUZE/BANDAGES/DRESSINGS) ×3 IMPLANT
GLOVE BIOGEL PI IND STRL 7.0 (GLOVE) ×1 IMPLANT
GLOVE BIOGEL PI INDICATOR 7.0 (GLOVE) ×2
GLOVE ECLIPSE 7.0 STRL STRAW (GLOVE) ×4 IMPLANT
GLOVE SURG SS PI 7.0 STRL IVOR (GLOVE) ×1 IMPLANT
GOWN PREVENTION PLUS XLARGE (GOWN DISPOSABLE) ×2 IMPLANT
GOWN STRL REIN XL XLG (GOWN DISPOSABLE) ×4 IMPLANT
NDL SPNL 22GX3.5 QUINCKE BK (NEEDLE) ×1 IMPLANT
NEEDLE SPNL 22GX3.5 QUINCKE BK (NEEDLE) ×2 IMPLANT
PACK VAGINAL MINOR WOMEN LF (CUSTOM PROCEDURE TRAY) ×2 IMPLANT
PAD OB MATERNITY 4.3X12.25 (PERSONAL CARE ITEMS) ×2 IMPLANT
PAD PREP 24X48 CUFFED NSTRL (MISCELLANEOUS) ×2 IMPLANT
SYR CONTROL 10ML LL (SYRINGE) ×2 IMPLANT
TOWEL OR 17X24 6PK STRL BLUE (TOWEL DISPOSABLE) ×4 IMPLANT
WATER STERILE IRR 1000ML POUR (IV SOLUTION) ×2 IMPLANT

## 2012-06-22 NOTE — H&P (Signed)
Nancy Fields is an 56 y.o. [phone] Unknown female.   Chief Complaint: post-menopausal bleeding HPI: 56 y.o.G2P2002 who has long h/o post-menopausal bleeding with complex hyperplasia with atypia.  For hysterectomy in 1/14.  For more definitive uterine sampling prior to proceeding with hyst.  Past Medical History  Diagnosis Date  . Abnormal vaginal bleeding   . Rosacea   . Cataract     bilateral eyes x 5 years ago.  . SVD (spontaneous vaginal delivery)     x 2  . Heart murmur     no prob  . H/O hiatal hernia      diet controlled - no meds  . Arthritis     knees/hands -  no meds  . Anemia     Past Surgical History  Procedure Date  . Dilation and curettage of uterus   . Abcess drainage   . Appendectomy   . Bilateral tubal ligation   . Tubal ligation   . Wisdom tooth extraction     Family History  Problem Relation Age of Onset  . Cancer Mother 31    lung cancer  . Stroke Father   . Cancer Brother     skin cancer  . Cancer Maternal Aunt     stomach  . Stroke Maternal Grandmother   . Cancer Maternal Grandfather     colon   Social History:  reports that she has never smoked. She has never used smokeless tobacco. She reports that she drinks alcohol. She reports that she does not use illicit drugs.  Allergies:  Allergies  Allergen Reactions  . Penicillins Hives  . Latex Rash    Medications Prior to Admission  Medication Sig Dispense Refill  . doxycycline (VIBRAMYCIN) 50 MG capsule Take 1 capsule (50 mg total) by mouth 1 day or 1 dose.  30 capsule  1  . ferrous sulfate 325 (65 FE) MG tablet Take 325 mg by mouth daily with breakfast.      . megestrol (MEGACE) 40 MG tablet Take 40 mg by mouth 2 (two) times daily.          A comprehensive review of systems was negative.  Blood pressure 128/76, pulse 65, temperature 98.1 F (36.7 C), temperature source Oral, resp. rate 18, height 5\' 1"  (1.549 m), weight 150 lb (68.04 kg), SpO2 100.00%. BP 128/76  Pulse 65  Temp  98.1 F (36.7 C) (Oral)  Resp 18  Ht 5\' 1"  (1.549 m)  Wt 150 lb (68.04 kg)  BMI 28.34 kg/m2  SpO2 100% General appearance: alert, cooperative and appears stated age Head: Normocephalic, without obvious abnormality, atraumatic Neck: supple, symmetrical, trachea midline Lungs: clear to auscultation bilaterally Heart: S1, S2 normal and systolic murmur: holosystolic 3/6, blowing throughout the precordium Abdomen: soft, non-tender; bowel sounds normal; no masses,  no organomegaly Extremities: extremities normal, atraumatic, no cyanosis or edema Pulses: 2+ and symmetric Skin: Skin color, texture, turgor normal. No rashes or lesions Lymph nodes: Cervical, supraclavicular, and axillary nodes normal. Neurologic: Grossly normal   Lab Results  Component Value Date   WBC 5.9 06/18/2012   HGB 14.7 06/18/2012   HCT 43.4 06/18/2012   MCV 96.0 06/18/2012   PLT 234 06/18/2012   Lab Results  Component Value Date   PREGTESTUR NEGATIVE 03/16/2011     Assessment/Plan Patient Active Problem List  Diagnosis  . DUB (dysfunctional uterine bleeding)  . Acne rosacea  . Complex endometrial hyperplasia with atypia  . Newly recognized heart murmur  . Poor compliance  with medical care  . Anemia due to blood loss, chronic   For Dilation and Curettage for complete endometrial sampling.  Nancy Fields S 06/22/2012, 1:33 PM

## 2012-06-22 NOTE — Anesthesia Postprocedure Evaluation (Signed)
  Anesthesia Post-op Note  Patient: Nancy Fields  Procedure(s) Performed: Procedure(s) (LRB) with comments: DILATATION AND CURETTAGE (N/A)  Patient is awake and responsive. Pain and nausea are reasonably well controlled. Vital signs are stable and clinically acceptable. Oxygen saturation is clinically acceptable. There are no apparent anesthetic complications at this time. Patient is ready for discharge.

## 2012-06-22 NOTE — Op Note (Signed)
PROCEDURE DATE: 06/22/2012  PREOPERATIVE DIAGNOSIS: Postmenopausal bleeding and complex hyperplasia with atypia on endometrial biopsy  POSTOPERATIVE DIAGNOSIS: The same  PROCEDURE:     Dilation and Evacuation.  SURGEON:  PRATT,TANYA S  INDICATIONS: 56 y.o. [id number] with h/o postmenopausal bleeding and biopsy confirmed complex hyperplasia with atypia.  She is for [facility] for above, but to be complete required full D and C prior to hysterectomy.  Risks of surgery were discussed with the patient including but not limited to: bleeding which may require transfusion; infection which may require antibiotics; injury to uterus or surrounding organs.  Likelihood of obtaining sample is high.  FINDINGS:  A 8wk size uterus, nml appearing cervix.  ANESTHESIA:    Monitored intravenous sedation, paracervical block-20 cc 1% lidocaine with epinephrine.  ESTIMATED BLOOD LOSS:  Less than 20 ml.  SPECIMENS:  Endometrial curettings to pathology  COMPLICATIONS:  None immediate.  PROCEDURE DETAILS:  The patient received intravenous antibiotics while in the preoperative area.  She was then taken to the operating room where general anesthesia was administered and was found to be adequate.  After an adequate timeout was performed, she was placed in the dorsal lithotomy position and examined; then prepped and draped in the sterile manner.   Her bladder was catheterized for an unmeasured amount of clear, yellow urine. A vaginal speculum was then placed in the patient's vagina and a single tooth tenaculum was applied to the anterior lip of the cervix.  A paracervical block using 1% Lidocaine with Epi was administered. The uterus sounded to 7 cm.  An ECC was obtained. The cervix was gently dilated to accommodate a small curette that was gently advanced to the uterine fundus.  A sharp curettage was then performed to obtain adequate specimen.There was minimal bleeding noted and the tenaculum removed with good hemostasis  noted.  The patient tolerated the procedure well.  The patient was taken to the recovery area in stable condition.  PRATT,TANYA SMD 06/22/2012 2:06 PM

## 2012-06-22 NOTE — Anesthesia Preprocedure Evaluation (Signed)
Anesthesia Evaluation  Patient identified by MRN, date of birth, ID band Patient awake    Reviewed: Allergy & Precautions, H&P , NPO status , Patient's Chart, lab work & pertinent test results  Airway Mallampati: II TM Distance: >3 FB Neck ROM: full    Dental No notable dental hx. (+) Teeth Intact   Pulmonary neg pulmonary ROS,    Pulmonary exam normal       Cardiovascular negative cardio ROS      Neuro/Psych negative neurological ROS  negative psych ROS   GI/Hepatic Neg liver ROS,   Endo/Other  negative endocrine ROS  Renal/GU negative Renal ROS  negative genitourinary   Musculoskeletal negative musculoskeletal ROS (+)   Abdominal Normal abdominal exam  (+)   Peds negative pediatric ROS (+)  Hematology negative hematology ROS (+)   Anesthesia Other Findings   Reproductive/Obstetrics negative OB ROS                           Anesthesia Physical Anesthesia Plan  ASA: II  Anesthesia Plan: General   Post-op Pain Management:    Induction: Intravenous  Airway Management Planned: LMA  Additional Equipment:   Intra-op Plan:   Post-operative Plan:   Informed Consent: I have reviewed the patients History and Physical, chart, labs and discussed the procedure including the risks, benefits and alternatives for the proposed anesthesia with the patient or authorized representative who has indicated his/her understanding and acceptance.     Plan Discussed with: CRNA and Surgeon  Anesthesia Plan Comments:         Anesthesia Quick Evaluation

## 2012-06-22 NOTE — Transfer of Care (Signed)
Immediate Anesthesia Transfer of Care Note  Patient: Nancy Fields  Procedure(s) Performed: Procedure(s) (LRB) with comments: DILATATION AND CURETTAGE (N/A)  Patient Location: PACU  Anesthesia Type:General  Level of Consciousness: awake  Airway & Oxygen Therapy: Patient Spontanous Breathing and Patient connected to nasal cannula oxygen  Post-op Assessment: Report given to PACU RN and Post -op Vital signs reviewed and stable  Post vital signs: Reviewed and stable  Complications: No apparent anesthesia complications

## 2012-06-25 ENCOUNTER — Encounter (HOSPITAL_COMMUNITY): Payer: Self-pay | Admitting: Family Medicine

## 2012-07-03 ENCOUNTER — Ambulatory Visit (INDEPENDENT_AMBULATORY_CARE_PROVIDER_SITE_OTHER): Payer: Self-pay | Admitting: Family Medicine

## 2012-07-03 ENCOUNTER — Encounter: Payer: Self-pay | Admitting: Family Medicine

## 2012-07-03 VITALS — BP 153/91 | HR 73 | Ht 61.0 in | Wt 154.0 lb

## 2012-07-03 DIAGNOSIS — N8502 Endometrial intraepithelial neoplasia [EIN]: Secondary | ICD-10-CM

## 2012-07-03 DIAGNOSIS — L719 Rosacea, unspecified: Secondary | ICD-10-CM

## 2012-07-03 MED ORDER — DOXYCYCLINE HYCLATE 50 MG PO CAPS: 50.0000 mg | ORAL_CAPSULE | ORAL | Status: DC

## 2012-07-03 MED ORDER — MEGESTROL ACETATE 40 MG PO TABS: 40.0000 mg | ORAL_TABLET | Freq: Two times a day (BID) | ORAL | Status: DC

## 2012-07-03 NOTE — Progress Notes (Signed)
Patient ID: Nancy Fields, female   DOB: Dec 31, 1955, 57 y.o.   MRN: [id number] Pt. Returns today to decline surgery.  She has a long h/o post-menopausal bleeding with complex endometrial hyperplasia with atypia.  She had a D and C in 07/2009 and this was found.  She failed to keep f/u appts.  She returned with bleeding and had another EMB on 12/2011 which showed this again.  She had another EMB in the office in Dec. Which did not demonstrate this diagnosis and another D and C a couple of weeks ago that showed no hyperplasia and was normal.  I was really trying to rule out worsening pathology, but she states she is not having pain and is only bleeding a little and does not want surgery.  Additionally, discussed how being on Progesterone for last 4 months, may have altered biopsy results.    She is concerned because it is major surgery and about risks, which were again reviewed with the pt.  She is also concerned because as part of this work up she was found to have cardiac murmur and aortic stenosis and has received an $1800 bill from cards.  She also has received a bill from anesthesia for the D and C.  She has no insurance, no job and no income.  She is concerned about further bills related to surgery.  She does get discounted/free care through the community care network with [facility].    We discussed that this problem is likely to continue and doubtful resolution without definitive rx plus risk of progression discussed.  We also, discussed cost of ER f/u, eval, anemia, cost of pads, and improved QOL with surgery.  After 30 minutes of counseling and review, she agreed to surgery and reports she will schedule her preop today.

## 2012-07-03 NOTE — Assessment & Plan Note (Addendum)
With normal D and C, I feel comfortable with straight TVH and no need for GYN Onc at this time.  Have discussed at length the risks and benefits of this surgery.  I feel strongly that she needs this procedure and have much contemplation, she will proceed. Continue Megace until surgery.

## 2012-07-03 NOTE — Patient Instructions (Signed)
Cancer of the Uterus The uterus is part of a woman's reproductive system. It is the hollow, pear-shaped organ where a baby grows. The uterus is in the pelvis between the bladder and the rectum. The narrow, lower portion of the uterus is the cervix. The fallopian tubes extend from either side of the top of the uterus to the ovaries. The wall of the uterus has two layers of tissue. The inner layer, or lining, is the endometrium. The outer layer is muscle tissue called the myometrium. In women of childbearing age, the lining of the uterus grows and thickens each month to prepare for pregnancy. If a woman does not become pregnant, the thick, bloody lining flows out of the body through the vagina. This flow is called menstruation. TYPES OF UTERINE CANCER  The most common type of cancer of the uterus begins in the lining (endometrium). It is called endometrial cancer, uterine cancer, or cancer of the uterus. It is seen in 2% to 3% of women.  A different type of cancer, uterine sarcoma, develops in the muscle (myometrium). Cancer that begins in the cervix is also a different type of cancer.  Rarely, a noncancerous fibroid tumor of the uterus develops into a sarcoma. CAUSES  No one knows the exact causes of uterine cancer. But it is clear that this disease is not contagious. No one can "catch" cancer from another person. Women who get this disease are more likely than other women to have certain risk factors. A risk factor is something that increases a person's chance of developing the disease.  Most women who have known risk factors do not get uterine cancer. On the other hand, many who do get this disease have none of these factors. Doctors can seldom explain why one woman gets uterine cancer and another does not.  Studies have found the following risk factors:  Age. Cancer of the uterus occurs mostly in women over age 50.  Endometrial hyperplasia (enlarged endometrium). The risk of uterine cancer is  higher if a woman has endometrial hyperplasia.  Hormone replacement therapy (HRT). HRT is used to control the symptoms of menopause, to prevent osteoporosis (thinning of the bones), and to reduce the risk of heart disease or stroke. Women who still have their uterus, and use estrogen without progesterone, have an increased risk of uterine cancer. Long-term use and large doses of estrogen seem to increase this risk. Women who use a combination of estrogen and progesterone have a lower risk of uterine cancer than women who use estrogen alone. The progesterone protects the uterus from developing cancer.  Obesity and related conditions. The body stores and releases some of its estrogen in fatty tissue. That is why obese women are more likely than thin women to have higher levels of estrogen in their bodies. High levels of estrogen may be the reason that obese women have an increased risk of developing uterine cancer. The risk of this disease is also higher in women with diabetes or high blood pressure. These conditions occur in many obese women.  Tamoxifen. Women taking the drug tamoxifen to prevent or treat breast cancer have an increased risk of uterine cancer. This risk appears to be related to the estrogen-like effect of this drug on the uterus.  Race. White women are more likely than African-American women to get uterine cancer.  Colorectal cancer. Women who have had an inherited form of colorectal cancer have a higher risk of developing uterine cancer than other women.  Infertility.  Beginning menstrual   periods before age 12.  Having menstrual periods after age 52.  History of cancer of the ovary or intestine.  Family history of uterine cancer.  Having diabetes, high blood pressure, thyroid or gallbladder disease.  Long-term use of high does of birth control pills. Birth control pills today are low in hormone doses.  Radiation to the abdomen or pelvis.  Smoking. SYMPTOMS  Uterine  cancer usually occurs after menopause. But it may also occur around the time that menopause begins. Abnormal vaginal bleeding is the most common symptom of uterine cancer. Bleeding may start as a watery, blood-streaked flow that gradually contains more blood. Women should not assume that abnormal vaginal bleeding is part of menopause. A woman should see her caregiver if she has any of the following symptoms:  Unusual vaginal bleeding or discharge.  Difficult or painful urination.  Pain during intercourse.  Pain in the pelvic area.  Increased girth (growth) of the stomach.  Any vaginal bleeding after menopause.  Unexplained weight loss. These symptoms can be caused by cancer or other less serious conditions. Most often they are not cancer. But a thorough evaluation is needed to be certain. DIAGNOSIS  If a woman has symptoms that suggest uterine cancer, her caregiver may check her general health and may order blood and urine tests. The caregiver also may perform one or more of these exams or tests.  Blood and urine tests and chest x-rays. The woman also may have:  Other X-rays.  CT scans.  Ultrasound test.  Magnetic resonance imaging (MRI).  Sigmoidoscopy.  Colonoscopy.  Pelvic exam. A woman will have a pelvic exam to check the vagina, uterus, bladder, and rectum. The caregiver feels these organs for any lumps or changes in their shape or size. To see the upper part of the vagina and the cervix, the caregiver inserts an instrument called a speculum into the vagina.  Pap test. The caregiver collects cells from the cervix and upper vagina. A medical laboratory checks for abnormal cells. The Pap test is better for detecting cancer of the cervix. But cells from inside the uterus usually do not show up on a Pap test. It is not a reliable test for uterine cancer.  Transvaginal ultrasound. The medical caregiver inserts an instrument into the vagina. The instrument aims high-frequency  sound waves at the uterus. The pattern of the echoes they produce creates a picture. If the endometrium looks too thick, the caregiver can do a biopsy.  Biopsy. The medical caregiver removes a sample of tissue from the uterine lining. This usually can be done in the caregiver's office.  Dilatation and Curettage (D&C). In some cases, a woman may need to have a D&C. D&C is usually done as same-day surgery with anesthesia in a hospital. A pathologist examines the tissue (lining of the uterus) to check for cancer cells and other conditions. STAGING   If uterine cancer is diagnosed, the caregiver needs to know the stage, or extent, of the disease to plan the best treatment. Staging is a careful attempt to find out whether the cancer has spread, and if so, to what parts of the body.  When uterine cancer spreads (metastasizes) outside the uterus, cancer cells are often found in nearby lymph nodes, nerves, or blood vessels. If the cancer has reached the lymph nodes, cancer cells may have spread to other lymph nodes and other organs of the body.  Staging is done at the time of surgery. In most cases, the most reliable way to stage   this disease is to remove the uterus, cervix, tubes, ovaries, and lymph nodes. A pathologist uses a microscope to examine the uterus and other tissues removed by the surgeon, to determine the extent of the cancer in the pelvis.  If lymph nodes have cancer cells, other parts of the body are examined, to see if it has spread to other organs. MAIN FEATURES OF EACH STAGE OF THE DISEASE: Stage I. The cancer is only in the body of the uterus. It is not in the cervix. Stage II. The cancer has spread from the body of the uterus to the cervix. Stage III. The cancer has spread outside the uterus, but not outside the pelvis (and not to the bladder or rectum). Lymph nodes in the pelvis may contain cancer cells. Stage IV. The cancer has spread into the bladder or rectum. It may have spread  beyond the pelvis to other body parts. TREATMENT  Women with uterine cancer have many treatment options. Most women with uterine cancer are treated with surgery. Some have radiation or chemotherapy. A smaller number of women may be treated with hormonal therapy. Some patients receive a combination of therapies. You may want to consult with another cancer doctor for a second opinion. The caregiver (usually a cancer doctor) is the best person to describe your treatment choices and to discuss the expected results of treatment. SURGERY  Most women with uterine cancer have surgery to remove the uterus, cervix, tubes, and ovaries (total hysterectomy). This is usually done through an incision in the abdomen.  The doctor may also remove the lymph nodes near the tumor, to see if they contain cancer. If cancer cells have reached the lymph nodes, it may mean that the disease has spread to other parts of the body. If cancer cells have not spread beyond the endometrium, the woman may not need to have any other treatment. The length of the hospital stay may vary from several days to a week. RADIATION THERAPY  In radiation therapy, high-energy rays are used to kill cancer cells. Like surgery, radiation therapy is a local therapy. It affects cancer cells only in the treated area.  Some women with Stage I, II, or III uterine cancer need both radiation therapy and surgery. They may have radiation before surgery to shrink the tumor, or after surgery to destroy any cancer cells that remain in the area. The doctor may suggest radiation treatments for the small number of women who cannot have surgery.  Doctors use two types of radiation therapy to treat uterine cancer:  External radiation. In external radiation therapy, a large machine outside the body is used to aim radiation at the tumor area. The woman usually does not stay overnight (outpatient) at the hospital or clinic, and receives external radiation 5 days a week  for several weeks. This schedule helps protect healthy cells and tissue by spreading out the total dose of radiation. No radioactive materials are put into the body for external radiation therapy.  Internal radiation. In internal radiation therapy, tiny tubes containing a radioactive substance are inserted through the vagina and cervix, into the uterus, and left in place for a few days. The woman stays in the hospital during this treatment. To protect others from radiation exposure, the patient may not be able to have visitors or may have visitors only for a short period of time while the implant is in place. Once the implant is removed, the woman has no radioactivity in her body.  Some patients need both   external and internal radiation therapies. CHEMOTHERAPY Chemotherapy is not usually used for endometrial cancer of the uterus. However, with sarcoma of the uterus or of the fibroid, it may be used in combination with surgery. Chemotherapy may also be used with recurring sarcoma, and in patients who cannot have surgery. HORMONE THERAPY Hormonal therapy involves substances that prevent cancer cells from multiplying or growing by attaching to hormone receptors. This causes changes in cancer cells. Before therapy begins, the caregiver may request a hormone receptor test. This special lab test of uterine tissue helps the caregiver learn if estrogen and progesterone receptors are present. If the tissue has receptors, the woman is more likely to respond to hormonal therapy.  Hormonal therapy is called a systemic therapy, because it can affect cancer cells throughout the body. Usually, hormonal therapy is a type of progesterone, taken as a pill or injection.  The doctor may use hormonal therapy for women with uterine cancer who are unable to have surgery or radiation therapy. Also, the doctor may give hormonal therapy to women with uterine cancer that has spread to the lungs or other distant sites. It is also  given to women with uterine cancer that has come back.  Hormonal therapy can cause a number of side effects. Women taking progesterone may retain fluid, have an increased appetite, and gain weight. Women who are still menstruating may have changes in their periods.  Hormone therapy can be used in combination with surgery or radiation. HOME CARE INSTRUCTIONS   Maintain a normal weight with a healthy balanced diet and exercise.  If you have diabetes, high blood pressure, thyroid or gallbladder disease, keep them in control with your caregiver's treatment and recommendations.  Do not smoke.  Do not take estrogen without taking progesterone with it, for menopausal symptoms.  Join a support group or get counseling, if you would like help dealing with your cancer.  If you are on hormone replacement therapy, see your caregiver as recommended, and be informed about the side effects of HRT.  Women with known risk factors should ask their caregiver what symptoms to look for and how often they should have an examination.  Keep your follow-up appointments and take your medicines as advised.  Write your questions down, and take them with you to your caregiver's appointments.  You may want another person to be with you for your appointments, so you do not miss any instructions. SEEK MEDICAL CARE IF:   You have any abnormal vaginal bleeding.  You are having menstrual periods at the age of 52 or older.  You have bleeding after sexual intercourse.  You are taking tomoxifen and develop vaginal bleeding.  Your stomach is growing, and you are not pregnant.  You have pain with sexual intercourse.  You have stomach or pelvis pain.  You have weight loss for no known reason.  You have pain or difficulty with urination. [facility]  Cancer Information Service (CIS) provides accurate, up-to-date information on cancer to patients and their families, health professionals, and  the general public:  Phone: 1-800-4-CANCER (1-800-422-6237).  Internet: http://www.cancer.gov NCI's website contains complete information about cancer causes and prevention, screening and diagnosis, treatment and survivorship, clinical trials, statistics, funding, training, and employment opportunities, and the Institute and its programs. CLINICAL TRIALS A woman who is interested in being part of a clinical trial should talk with her caregiver. NCI's website (http://www.cancer.gov) provides general information about clinical trials. It also offers detailed information about specific ongoing studies of uterine   cancer by linking to PDQ, a cancer information database developed by the NCI. The Cancer Information Service at 1-800-4-CANCER can answer questions about cancer and provide information from the PDQ database. Document Released: 06/13/2005 Document Revised: 09/05/2011 Document Reviewed: 04/16/2009 Exit[employer] Patient Information 2013 [employer].  

## 2012-07-03 NOTE — Progress Notes (Signed)
Surgery consult, needs refills of medication as well.

## 2012-07-03 NOTE — Assessment & Plan Note (Signed)
Refilled her doxy today

## 2012-07-04 ENCOUNTER — Encounter (HOSPITAL_COMMUNITY): Payer: Self-pay

## 2012-07-04 ENCOUNTER — Encounter (HOSPITAL_COMMUNITY)
Admission: RE | Admit: 2012-07-04 | Discharge: 2012-07-04 | Disposition: A | Discharge status: Home / Self Care | Payer: Self-pay | Source: Ambulatory Visit | Attending: Family Medicine | Admitting: Family Medicine

## 2012-07-04 LAB — SURGICAL PCR SCREEN
MRSA, PCR: INVALID — AB
Staphylococcus aureus: INVALID — AB

## 2012-07-04 NOTE — Patient Instructions (Addendum)
     Your procedure is scheduled on:  Monday, Jan 13  Enter through the Main Entrance of [facility] at: 1130 am Pick up the phone at the desk and dial [phone] and inform us of your arrival.  Please call this number if you have any problems the morning of surgery: [phone]  Remember: Do not eat food after midnight: Sunday Do not drink clear liquids after: 9 am Monday Take these medicines the morning of surgery with a SIP OF WATER:  None  Do not wear jewelry, make-up, or FINGER nail polish No metal in your hair or on your body. Do not wear lotions, powders, perfumes. You may wear deodorant.  Please use your CHG wash as directed prior to surgery.  Do not shave anywhere for at least 12 hours prior to first CHG shower.  Do not bring valuables to the hospital. Contacts, dentures or bridgework may not be worn into surgery.  Leave suitcase in the car. After Surgery it may be brought to your room. For patients being admitted to the hospital, checkout time is 11:00am the day of discharge.  Home with daughter Nancy Fields.

## 2012-07-07 LAB — MRSA CULTURE

## 2012-07-09 ENCOUNTER — Observation Stay (HOSPITAL_COMMUNITY)
Admission: RE | Admit: 2012-07-09 | Discharge: 2012-07-10 | Disposition: A | Discharge status: Home / Self Care | Payer: Self-pay | Source: Ambulatory Visit | Attending: Family Medicine | Admitting: Family Medicine

## 2012-07-09 ENCOUNTER — Inpatient Hospital Stay (HOSPITAL_COMMUNITY): Payer: Self-pay | Admitting: Anesthesiology

## 2012-07-09 ENCOUNTER — Encounter (HOSPITAL_COMMUNITY): Payer: Self-pay | Admitting: *Deleted

## 2012-07-09 ENCOUNTER — Encounter (HOSPITAL_COMMUNITY)
Admission: RE | Disposition: A | Discharge status: Home / Self Care | Payer: Self-pay | Source: Ambulatory Visit | Attending: Family Medicine

## 2012-07-09 ENCOUNTER — Encounter (HOSPITAL_COMMUNITY): Payer: Self-pay | Admitting: Anesthesiology

## 2012-07-09 DIAGNOSIS — D500 Iron deficiency anemia secondary to blood loss (chronic): Secondary | ICD-10-CM | POA: Diagnosis present

## 2012-07-09 DIAGNOSIS — N8000 Endometriosis of the uterus, unspecified: Secondary | ICD-10-CM | POA: Insufficient documentation

## 2012-07-09 DIAGNOSIS — N838 Other noninflammatory disorders of ovary, fallopian tube and broad ligament: Secondary | ICD-10-CM | POA: Insufficient documentation

## 2012-07-09 DIAGNOSIS — N938 Other specified abnormal uterine and vaginal bleeding: Secondary | ICD-10-CM | POA: Diagnosis present

## 2012-07-09 DIAGNOSIS — N8501 Benign endometrial hyperplasia: Secondary | ICD-10-CM | POA: Insufficient documentation

## 2012-07-09 DIAGNOSIS — N920 Excessive and frequent menstruation with regular cycle: Principal | ICD-10-CM | POA: Insufficient documentation

## 2012-07-09 DIAGNOSIS — N8502 Endometrial intraepithelial neoplasia [EIN]: Secondary | ICD-10-CM | POA: Diagnosis present

## 2012-07-09 DIAGNOSIS — N949 Unspecified condition associated with female genital organs and menstrual cycle: Secondary | ICD-10-CM

## 2012-07-09 HISTORY — PX: SALPINGOOPHORECTOMY: SHX82

## 2012-07-09 HISTORY — PX: VAGINAL HYSTERECTOMY: SHX2639

## 2012-07-09 SURGERY — HYSTERECTOMY, VAGINAL
Anesthesia: General | Site: Vagina | Wound class: Clean Contaminated

## 2012-07-09 MED ORDER — METRONIDAZOLE IN NACL 5-0.79 MG/ML-% IV SOLN
500.0000 mg | INTRAVENOUS | Status: AC
  Administered 2012-07-09: 500 mg via INTRAVENOUS
  Filled 2012-07-09: qty 100

## 2012-07-09 MED ORDER — LIDOCAINE HCL (CARDIAC) 20 MG/ML IV SOLN
INTRAVENOUS | Status: DC | PRN
  Administered 2012-07-09: 100 mg via INTRAVENOUS

## 2012-07-09 MED ORDER — ESTRADIOL 0.1 MG/GM VA CREA
TOPICAL_CREAM | VAGINAL | Status: DC | PRN
  Administered 2012-07-09: 1 via VAGINAL

## 2012-07-09 MED ORDER — NEOSTIGMINE METHYLSULFATE 1 MG/ML IJ SOLN
INTRAMUSCULAR | Status: DC | PRN
  Administered 2012-07-09: 4 mg via INTRAVENOUS

## 2012-07-09 MED ORDER — LIDOCAINE-EPINEPHRINE 1 %-1:100000 IJ SOLN
INTRAMUSCULAR | Status: DC | PRN
  Administered 2012-07-09: 18 mL

## 2012-07-09 MED ORDER — PHENYLEPHRINE HCL 10 MG/ML IJ SOLN
INTRAMUSCULAR | Status: DC | PRN
  Administered 2012-07-09: 80 ug via INTRAVENOUS
  Administered 2012-07-09 (×3): 40 ug via INTRAVENOUS

## 2012-07-09 MED ORDER — KETOROLAC TROMETHAMINE 30 MG/ML IJ SOLN: 15.0000 mg | Freq: Once | INTRAMUSCULAR | Status: DC | PRN

## 2012-07-09 MED ORDER — KETOROLAC TROMETHAMINE 30 MG/ML IJ SOLN
30.0000 mg | Freq: Once | INTRAMUSCULAR | Status: AC
  Administered 2012-07-09: 30 mg via INTRAVENOUS

## 2012-07-09 MED ORDER — ONDANSETRON HCL 4 MG/2ML IJ SOLN
INTRAMUSCULAR | Status: DC | PRN
  Administered 2012-07-09: 4 mg via INTRAVENOUS

## 2012-07-09 MED ORDER — MIDAZOLAM HCL 2 MG/2ML IJ SOLN
INTRAMUSCULAR | Status: AC
  Filled 2012-07-09: qty 2

## 2012-07-09 MED ORDER — LACTATED RINGERS IV SOLN
INTRAVENOUS | Status: DC | PRN
  Administered 2012-07-09: 12:00:00 via INTRAVENOUS

## 2012-07-09 MED ORDER — KETOROLAC TROMETHAMINE 30 MG/ML IJ SOLN
30.0000 mg | Freq: Four times a day (QID) | INTRAMUSCULAR | Status: DC
  Administered 2012-07-10 (×2): 30 mg via INTRAVENOUS
  Filled 2012-07-09 (×2): qty 1

## 2012-07-09 MED ORDER — PROMETHAZINE HCL 25 MG/ML IJ SOLN: 6.2500 mg | INTRAMUSCULAR | Status: DC | PRN

## 2012-07-09 MED ORDER — GENTAMICIN SULFATE 40 MG/ML IJ SOLN
5.0000 mg/kg | INTRAVENOUS | Status: AC
  Administered 2012-07-09: 284 mg via INTRAVENOUS
  Filled 2012-07-09: qty 7.1

## 2012-07-09 MED ORDER — OXYCODONE-ACETAMINOPHEN 5-325 MG PO TABS
1.0000 | ORAL_TABLET | ORAL | Status: DC | PRN
Start: 2012-07-09 — End: 2012-07-10
  Administered 2012-07-10: 2 via ORAL
  Filled 2012-07-09: qty 2

## 2012-07-09 MED ORDER — FENTANYL CITRATE 0.05 MG/ML IJ SOLN
INTRAMUSCULAR | Status: DC | PRN
  Administered 2012-07-09: 50 ug via INTRAVENOUS
  Administered 2012-07-09: 75 ug via INTRAVENOUS
  Administered 2012-07-09: 100 ug via INTRAVENOUS
  Administered 2012-07-09: 25 ug via INTRAVENOUS

## 2012-07-09 MED ORDER — MEPERIDINE HCL 25 MG/ML IJ SOLN: 6.2500 mg | INTRAMUSCULAR | Status: DC | PRN

## 2012-07-09 MED ORDER — FENTANYL CITRATE 0.05 MG/ML IJ SOLN
INTRAMUSCULAR | Status: AC
  Filled 2012-07-09: qty 5

## 2012-07-09 MED ORDER — MENTHOL 3 MG MT LOZG: 1.0000 | LOZENGE | OROMUCOSAL | Status: DC | PRN

## 2012-07-09 MED ORDER — GLYCOPYRROLATE 0.2 MG/ML IJ SOLN
INTRAMUSCULAR | Status: DC | PRN
  Administered 2012-07-09: 0.1 mg via INTRAVENOUS
  Administered 2012-07-09: 0.6 mg via INTRAVENOUS

## 2012-07-09 MED ORDER — PROPOFOL 10 MG/ML IV EMUL
INTRAVENOUS | Status: DC | PRN
  Administered 2012-07-09: 200 mg via INTRAVENOUS

## 2012-07-09 MED ORDER — ZOLPIDEM TARTRATE 5 MG PO TABS: 5.0000 mg | ORAL_TABLET | Freq: Every evening | ORAL | Status: DC | PRN

## 2012-07-09 MED ORDER — GLYCOPYRROLATE 0.2 MG/ML IJ SOLN
INTRAMUSCULAR | Status: AC
  Filled 2012-07-09: qty 3

## 2012-07-09 MED ORDER — ROCURONIUM BROMIDE 100 MG/10ML IV SOLN
INTRAVENOUS | Status: DC | PRN
  Administered 2012-07-09: 40 mg via INTRAVENOUS

## 2012-07-09 MED ORDER — DEXAMETHASONE SODIUM PHOSPHATE 4 MG/ML IJ SOLN
INTRAMUSCULAR | Status: DC | PRN
  Administered 2012-07-09: 10 mg via INTRAVENOUS

## 2012-07-09 MED ORDER — MIDAZOLAM HCL 2 MG/2ML IJ SOLN: 0.5000 mg | Freq: Once | INTRAMUSCULAR | Status: DC | PRN

## 2012-07-09 MED ORDER — MIDAZOLAM HCL 5 MG/5ML IJ SOLN
INTRAMUSCULAR | Status: DC | PRN
  Administered 2012-07-09: 2 mg via INTRAVENOUS

## 2012-07-09 MED ORDER — ONDANSETRON HCL 4 MG/2ML IJ SOLN: 4.0000 mg | Freq: Four times a day (QID) | INTRAMUSCULAR | Status: DC | PRN

## 2012-07-09 MED ORDER — LACTATED RINGERS IV SOLN
INTRAVENOUS | Status: DC
  Administered 2012-07-09 – 2012-07-10 (×2): via INTRAVENOUS

## 2012-07-09 MED ORDER — LACTATED RINGERS IV SOLN
INTRAVENOUS | Status: DC
  Administered 2012-07-09: 125 mL/h via INTRAVENOUS

## 2012-07-09 MED ORDER — KETOROLAC TROMETHAMINE 30 MG/ML IJ SOLN: 30.0000 mg | Freq: Four times a day (QID) | INTRAMUSCULAR | Status: DC

## 2012-07-09 MED ORDER — ESTRADIOL 0.1 MG/GM VA CREA
TOPICAL_CREAM | VAGINAL | Status: AC
  Filled 2012-07-09: qty 42.5

## 2012-07-09 MED ORDER — KETOROLAC TROMETHAMINE 30 MG/ML IJ SOLN
INTRAMUSCULAR | Status: AC
  Administered 2012-07-09: 30 mg via INTRAVENOUS
  Filled 2012-07-09: qty 1

## 2012-07-09 MED ORDER — DIPHENHYDRAMINE HCL 12.5 MG/5ML PO ELIX: 12.5000 mg | ORAL_SOLUTION | Freq: Four times a day (QID) | ORAL | Status: DC | PRN

## 2012-07-09 MED ORDER — MORPHINE SULFATE (PF) 1 MG/ML IV SOLN
INTRAVENOUS | Status: DC
  Administered 2012-07-09: 17:00:00 via INTRAVENOUS
  Administered 2012-07-09: 6 mL via INTRAVENOUS
  Administered 2012-07-10: 4 mL via INTRAVENOUS
  Administered 2012-07-10: 1 mL via INTRAVENOUS
  Administered 2012-07-10: 4 mg via INTRAVENOUS
  Filled 2012-07-09: qty 25

## 2012-07-09 MED ORDER — FENTANYL CITRATE 0.05 MG/ML IJ SOLN
INTRAMUSCULAR | Status: AC
  Administered 2012-07-09: 50 ug via INTRAVENOUS
  Filled 2012-07-09: qty 2

## 2012-07-09 MED ORDER — PHENYLEPHRINE 40 MCG/ML (10ML) SYRINGE FOR IV PUSH (FOR BLOOD PRESSURE SUPPORT)
PREFILLED_SYRINGE | INTRAVENOUS | Status: AC
  Filled 2012-07-09: qty 5

## 2012-07-09 MED ORDER — ONDANSETRON HCL 4 MG/2ML IJ SOLN
INTRAMUSCULAR | Status: AC
  Filled 2012-07-09: qty 2

## 2012-07-09 MED ORDER — DIPHENHYDRAMINE HCL 50 MG/ML IJ SOLN: 12.5000 mg | Freq: Four times a day (QID) | INTRAMUSCULAR | Status: DC | PRN

## 2012-07-09 MED ORDER — FENTANYL CITRATE 0.05 MG/ML IJ SOLN
25.0000 ug | INTRAMUSCULAR | Status: DC | PRN
  Administered 2012-07-09: 50 ug via INTRAVENOUS

## 2012-07-09 MED ORDER — 0.9 % SODIUM CHLORIDE (POUR BTL) OPTIME
TOPICAL | Status: DC | PRN
  Administered 2012-07-09: 1000 mL

## 2012-07-09 MED ORDER — NALOXONE HCL 0.4 MG/ML IJ SOLN: 0.4000 mg | INTRAMUSCULAR | Status: DC | PRN

## 2012-07-09 MED ORDER — SODIUM CHLORIDE 0.9 % IJ SOLN: 9.0000 mL | INTRAMUSCULAR | Status: DC | PRN

## 2012-07-09 MED ORDER — DOXYCYCLINE HYCLATE 50 MG PO CAPS
50.0000 mg | ORAL_CAPSULE | ORAL | Status: DC
  Filled 2012-07-09: qty 1

## 2012-07-09 MED ORDER — NEOSTIGMINE METHYLSULFATE 1 MG/ML IJ SOLN
INTRAMUSCULAR | Status: AC
  Filled 2012-07-09: qty 1

## 2012-07-09 MED ORDER — PROPOFOL 10 MG/ML IV EMUL
INTRAVENOUS | Status: AC
  Filled 2012-07-09: qty 20

## 2012-07-09 MED ORDER — LIDOCAINE HCL (CARDIAC) 20 MG/ML IV SOLN
INTRAVENOUS | Status: AC
  Filled 2012-07-09: qty 5

## 2012-07-09 MED ORDER — IBUPROFEN 600 MG PO TABS: 600.0000 mg | ORAL_TABLET | Freq: Four times a day (QID) | ORAL | Status: DC | PRN

## 2012-07-09 MED ORDER — INFLUENZA VIRUS VACC SPLIT PF IM SUSP
0.5000 mL | INTRAMUSCULAR | Status: AC
  Administered 2012-07-10: 0.5 mL via INTRAMUSCULAR

## 2012-07-09 MED ORDER — DEXAMETHASONE SODIUM PHOSPHATE 10 MG/ML IJ SOLN
INTRAMUSCULAR | Status: AC
  Filled 2012-07-09: qty 1

## 2012-07-09 SURGICAL SUPPLY — 28 items
CANISTER SUCTION 2500CC (MISCELLANEOUS) ×3 IMPLANT
CLOTH BEACON ORANGE TIMEOUT ST (SAFETY) ×3 IMPLANT
CONT PATH 16OZ SNAP LID 3702 (MISCELLANEOUS) IMPLANT
DECANTER SPIKE VIAL GLASS SM (MISCELLANEOUS) IMPLANT
DRESSING TELFA 8X3 (GAUZE/BANDAGES/DRESSINGS) ×3 IMPLANT
GAUZE PACKING 2X5 YD STERILE (GAUZE/BANDAGES/DRESSINGS) ×1 IMPLANT
GLOVE BIOGEL PI IND STRL 6.5 (GLOVE) ×2 IMPLANT
GLOVE BIOGEL PI IND STRL 7.0 (GLOVE) ×2 IMPLANT
GLOVE BIOGEL PI INDICATOR 6.5 (GLOVE) ×1
GLOVE BIOGEL PI INDICATOR 7.0 (GLOVE) ×1
GLOVE ECLIPSE 7.0 STRL STRAW (GLOVE) ×6 IMPLANT
GOWN PREVENTION PLUS XLARGE (GOWN DISPOSABLE) ×3 IMPLANT
GOWN STRL REIN XL XLG (GOWN DISPOSABLE) ×12 IMPLANT
NDL SPNL 22GX3.5 QUINCKE BK (NEEDLE) IMPLANT
NEEDLE HYPO 22GX1.5 SAFETY (NEEDLE) IMPLANT
NEEDLE SPNL 22GX3.5 QUINCKE BK (NEEDLE) IMPLANT
NS IRRIG 1000ML POUR BTL (IV SOLUTION) ×3 IMPLANT
PACK VAGINAL WOMENS (CUSTOM PROCEDURE TRAY) ×3 IMPLANT
PAD OB MATERNITY 4.3X12.25 (PERSONAL CARE ITEMS) ×3 IMPLANT
SUT VIC AB 0 CT1 18XCR BRD8 (SUTURE) ×6 IMPLANT
SUT VIC AB 0 CT1 27 (SUTURE) ×6
SUT VIC AB 0 CT1 27XBRD ANBCTR (SUTURE) ×4 IMPLANT
SUT VIC AB 0 CT1 8-18 (SUTURE) ×9
SUT VICRYL 0 TIES 12 18 (SUTURE) ×3 IMPLANT
TOWEL OR 17X24 6PK STRL BLUE (TOWEL DISPOSABLE) ×6 IMPLANT
TRAY FOLEY CATH 14FR (SET/KITS/TRAYS/PACK) ×2 IMPLANT
TRAY FOLEY METER SIL LF 16FR (CATHETERS) ×1 IMPLANT
WATER STERILE IRR 1000ML POUR (IV SOLUTION) ×3 IMPLANT

## 2012-07-09 NOTE — Anesthesia Postprocedure Evaluation (Signed)
Anesthesia Post Note  Patient: Nancy Fields  Procedure(s) Performed: Procedure(s) (LRB): HYSTERECTOMY VAGINAL (N/A) SALPINGO OOPHORECTOMY (Bilateral)  Anesthesia type: General  Patient location: PACU  Post pain: Pain level controlled  Post assessment: Post-op Vital signs reviewed  Last Vitals:  Filed Vitals:   07/09/12 1515  BP: 98/56  Pulse: 68  Temp: 36.7 C  Resp: 20    Post vital signs: Reviewed  Level of consciousness: sedated  Complications: No apparent anesthesia complications

## 2012-07-09 NOTE — Addendum Note (Signed)
Addendum  created 07/09/12 1757 by Orlie Pollen, CRNA   Modules edited:Notes Section

## 2012-07-09 NOTE — Anesthesia Preprocedure Evaluation (Addendum)
Anesthesia Evaluation  Patient identified by MRN, date of birth, ID band Patient awake    Reviewed: Allergy & Precautions, H&P , Patient's Chart, lab work & pertinent test results, reviewed documented beta blocker date and time   History of Anesthesia Complications Negative for: history of anesthetic complications  Airway Mallampati: II TM Distance: >3 FB Neck ROM: full    Dental No notable dental hx.    Pulmonary neg pulmonary ROS,  breath sounds clear to auscultation  Pulmonary exam normal       Cardiovascular Exercise Tolerance: Good negative cardio ROS  Rhythm:regular Rate:Normal     Neuro/Psych negative neurological ROS  negative psych ROS   GI/Hepatic negative GI ROS, Neg liver ROS, hiatal hernia,   Endo/Other  negative endocrine ROS  Renal/GU negative Renal ROS     Musculoskeletal   Abdominal   Peds  Hematology negative hematology ROS (+)   Anesthesia Other Findings Abnormal vaginal bleeding     Rosacea        Cataract   bilateral eyes x 5 years ago. SVD (spontaneous vaginal delivery)   x 2    Heart murmur   no prob H/O hiatal hernia    diet controlled - no meds    Arthritis   knees/hands -  no meds Anemia    Reproductive/Obstetrics negative OB ROS                           Anesthesia Physical Anesthesia Plan  ASA: II  Anesthesia Plan: General ETT   Post-op Pain Management:    Induction:   Airway Management Planned:   Additional Equipment:   Intra-op Plan:   Post-operative Plan:   Informed Consent: I have reviewed the patients History and Physical, chart, labs and discussed the procedure including the risks, benefits and alternatives for the proposed anesthesia with the patient or authorized representative who has indicated his/her understanding and acceptance.   Dental Advisory Given  Plan Discussed with: CRNA, Surgeon and Anesthesiologist  Anesthesia Plan  Comments:        Anesthesia Quick Evaluation

## 2012-07-09 NOTE — Anesthesia Postprocedure Evaluation (Signed)
  Anesthesia Post-op Note  Patient: Nancy Fields  Procedure(s) Performed: Procedure(s) (LRB) with comments: HYSTERECTOMY VAGINAL (N/A) SALPINGO OOPHORECTOMY (Bilateral)  Patient Location: [facility]  Anesthesia Type:General  Level of Consciousness: awake, alert  and oriented  Airway and Oxygen Therapy: Patient Spontanous Breathing  Post-op Pain: mild  Post-op Assessment: Post-op Vital signs reviewed and Patient's Cardiovascular Status Stable  Post-op Vital Signs: Reviewed and stable  Complications: No apparent anesthesia complications

## 2012-07-09 NOTE — Transfer of Care (Signed)
Immediate Anesthesia Transfer of Care Note  Patient: Nancy Fields  Procedure(s) Performed: Procedure(s) (LRB) with comments: HYSTERECTOMY VAGINAL (N/A) SALPINGO OOPHORECTOMY (Bilateral)  Patient Location: PACU  Anesthesia Type:General  Level of Consciousness: awake, alert  and oriented  Airway & Oxygen Therapy: Patient Spontanous Breathing and Patient connected to nasal cannula oxygen  Post-op Assessment: Report given to PACU RN  Post vital signs: Reviewed and stable  Complications: No apparent anesthesia complications

## 2012-07-09 NOTE — Op Note (Signed)
Preoperative diagnosis: Complex Hyperplasia with atypia, menorrhagia, anemia  Postoperative diagnosis: Same  Procedure: Transvaginal hysterectomy, bilateral salpingo-oophorectomy   Surgeon: Shelbie Proctor. Shawnie Pons, M.D.  Assistant: Elsie Lincoln, MD  Anesthesia: General ETT-Paracervical Lahoma Crocker, MD  Findings: 10-12 wk size uterus 345 gms.  Estimated blood loss: 250 cc  Specimen: Uterus to pathology  Reason for procedure: Patient had long h/o bleeding and multiple samplings which showed complex hyperplasia with atypia.  Had 2 previous D and C and was continuously bleeding on Megace twice daily.  Risks of  hysterectomy reviewed.  Risks include but are not limited to bleeding, infection, injury to surrounding structures, including bowel, bladder and ureters, blood clots, and death.  Likelihood of success of surgery is high.   Procedure: Patient was taken to the OR where she was placed in dorsal lithotomy in Allen stirrups. She was prepped and draped in the usual sterile fashion. A timeout was performed. The patient received Gentamycin and Flagyl prior to procedure. The patient had SCDs in place.  A speculum was placed inside the vagina. The cervix was visualized and grasped with 2 double-tooth tenacula. 20 cc of 1% lidocaine with epinephrine were injected paracervically. A knife was used to make a circumferential incision around the vagina. An opened sponge was used to dissect the vagina off the cervix. The posterior peritoneum was entered sharply with Mayo scissors. The posterior peritoneum was tagged to the vaginal cuff with a single stitch. The anterior peritoneal cavity was entered sharply with careful dissection of the bladder off the underlying cervix. A Heaney clamp was used to clamp first the left uterosacral ligament and cardinal which was then cut and Haney suture ligated with 0 Vicryl stitch, the stitch was held. Similarly the right uterosacral ligament was clamped cut and suture  ligated. Sequential bites up the broad to the uterine arteries were taken.  The uterus was then cored and removed in pieces and the right utero-ovarian pedicle grasped with a Heaney clamp. A free stitch, followed by Heaney ligature used for hemostasis. The left utero-ovarian pedicle was similarly grasped with the Heaney clamp. The left ovary and tube were grasped with Babcock clamp and a Heaney clamp placed behind this. Tube and ovary were removed and the IP ligated with a free tie followed by suture ligature. The right tube and ovary were similarly grasped with a Babcock clamp and a Heaney clamp used to clamp behind this. The tube and ovary were removed on the side and the IP was ligated with a free tie followed by suture ligature. Inspection of all pedicles revealed adequate hemostasis. There was some bleeding noted at the vaginal cuff on the left side. The vagina was closed with 0 Vicryl suture in a locked running fashion with care taken to incorporate the uterosacral pedicles. Excellent hemostasis was noted at the end of the case. The vaginal cuff was inspected there was minimal bleeding noted.  A Foley catheter is placed inside her bladder. Clear, yellow urine was noted. 1 inch vaginal packing in Estrace cream inserted into to vagina.  All instrument needle and lap counts were correct x 2. Patient was awakened taken to recovery room in stable condition.  Reva Bores, MD 07/09/2012, 2:12 PM

## 2012-07-09 NOTE — Anesthesia Procedure Notes (Signed)
Procedure Name: Intubation Date/Time: 07/09/2012 12:34 PM Performed by: Judi Jaffe, Jannet Askew Pre-anesthesia Checklist: Patient identified, Timeout performed, Emergency Drugs available, Suction available and Patient being monitored Patient Re-evaluated:Patient Re-evaluated prior to inductionOxygen Delivery Method: Circle system utilized Preoxygenation: Pre-oxygenation with 100% oxygen Intubation Type: IV induction Ventilation: Mask ventilation without difficulty Laryngoscope Size: Mac Grade View: Grade II Number of attempts: 1 Placement Confirmation: ETT inserted through vocal cords under direct vision,  breath sounds checked- equal and bilateral and positive ETCO2 Secured at: 24 cm Dental Injury: Teeth and Oropharynx as per pre-operative assessment

## 2012-07-09 NOTE — H&P (Addendum)
  Nancy Fields is an 57 y.o. G75P2002 female.   Chief Complaint: Persistent vaginal bleeding. HPI: With long h/o vaginal bleeding.  Found to have complex hyperplasia with atypia x 2.  Never followed up and returned with bleeding.  Has undergone repeat endometrial sampling and D and C with no worsening pathology found.  She has been on Progesterone for several months.  Pelvic U/S appears normal.  Past Medical History  Diagnosis Date  . Abnormal vaginal bleeding   . Rosacea   . Cataract     bilateral eyes x 5 years ago.  . SVD (spontaneous vaginal delivery)     x 2  . Heart murmur     no prob  . H/O hiatal hernia      diet controlled - no meds  . Arthritis     knees/hands -  no meds  . Anemia     Past Surgical History  Procedure Date  . Dilation and curettage of uterus   . Abcess drainage   . Appendectomy   . Bilateral tubal ligation   . Tubal ligation   . Wisdom tooth extraction   . Dilation and curettage of uterus 06/22/2012    Procedure: DILATATION AND CURETTAGE;  Surgeon: Reva Bores, MD;  Location: [facility];  Service: Gynecology;  Laterality: N/A;    Family History  Problem Relation Age of Onset  . Cancer Mother 66    lung cancer  . Stroke Father   . Cancer Brother     skin cancer  . Cancer Maternal Aunt     stomach  . Stroke Maternal Grandmother   . Cancer Maternal Grandfather     colon   Social History:  reports that she has never smoked. She has never used smokeless tobacco. She reports that she drinks alcohol. She reports that she does not use illicit drugs.  Allergies:  Allergies  Allergen Reactions  . Penicillins Hives  . Latex Rash    No prescriptions prior to admission     A comprehensive review of systems was negative.  Head: Normocephalic, without obvious abnormality, atraumatic Eyes: negative findings: conjunctivae and sclerae normal Neck: supple, symmetrical, trachea midline Lungs: clear to auscultation bilaterally Heart: regular rate and  rhythm and systolic murmur: holosystolic 3/6, medium pitch throughout the precordium Abdomen: soft, non-tender; bowel sounds normal; no masses,  no organomegaly Extremities: extremities normal, atraumatic, no cyanosis or edema Pulses: 2+ and symmetric Skin: Skin color, texture, turgor normal. No rashes or lesions Neurologic: Grossly normal   Lab Results  Component Value Date   WBC 5.9 06/18/2012   HGB 14.7 06/18/2012   HCT 43.4 06/18/2012   MCV 96.0 06/18/2012   PLT 234 06/18/2012   Lab Results  Component Value Date   PREGTESTUR NEGATIVE 03/16/2011     Assessment/Plan Patient Active Problem List  Diagnosis  . DUB (dysfunctional uterine bleeding)  . Acne rosacea  . Complex endometrial hyperplasia with atypia  . Aortic valve stenosis, moderate  . Poor compliance with medical care  . Anemia due to blood loss, chronic   For TVH and BSO today.  Ronne Savoia S 07/09/2012, 11:04 AM

## 2012-07-10 ENCOUNTER — Encounter (HOSPITAL_COMMUNITY): Payer: Self-pay | Admitting: Family Medicine

## 2012-07-10 ENCOUNTER — Telehealth: Payer: Self-pay | Admitting: Family Medicine

## 2012-07-10 LAB — CBC
Hemoglobin: 12.3 g/dL (ref 12.0–15.0)
MCHC: 33.2 g/dL (ref 30.0–36.0)
Platelets: 215 10*3/uL (ref 150–400)
RDW: 13.3 % (ref 11.5–15.5)

## 2012-07-10 MED ORDER — OXYCODONE-ACETAMINOPHEN 5-325 MG PO TABS: 1.0000 | ORAL_TABLET | Freq: Four times a day (QID) | ORAL | Status: DC | PRN

## 2012-07-10 NOTE — Telephone Encounter (Signed)
Called pt. With results of her pathology---No cancer!!!

## 2012-07-10 NOTE — Discharge Summary (Signed)
Physician Discharge Summary  Patient ID: Nancy Fields MRN: [id number] DOB/AGE: 02/25/1956 57 y.o.  Admit date: 07/09/2012 Discharge date:   Admission Diagnoses:  Principal Problem:  *Complex endometrial hyperplasia with atypia Active Problems:  DUB (dysfunctional uterine bleeding)  Anemia due to blood loss, chronic   Discharge Diagnoses:  Same  Past Medical History  Diagnosis Date  . Abnormal vaginal bleeding   . Rosacea   . Cataract     bilateral eyes x 5 years ago.  . SVD (spontaneous vaginal delivery)     x 2  . Heart murmur     no prob  . H/O hiatal hernia      diet controlled - no meds  . Arthritis     knees/hands -  no meds  . Anemia     Surgeries: Procedure(s): HYSTERECTOMY VAGINAL SALPINGO OOPHORECTOMY on 07/09/2012   Consultants:  None  Discharged Condition: Improved  Hospital Course: ALYN RIEDINGER is an 57 y.o. female G2P2002 who was admitted 07/09/2012 with a chief complaint of vaginal bleeding, and found to have Complex endometrial hyperplasia with atypia. She was admitted for the above procedure. They were brought to the operating room on 07/09/2012 and underwent the above named procedures.    They were given perioperative antibiotics:  Anti-infectives     Start     Dose/Rate Route Frequency Ordered Stop   07/09/12 1915   doxycycline (VIBRAMYCIN) 50 MG capsule 50 mg        50 mg Oral 1 Day/Dose 07/09/12 1910     07/09/12 1130   metroNIDAZOLE (FLAGYL) IVPB 500 mg        500 mg 100 mL/hr over 60 Minutes Intravenous On call to O.R. 07/09/12 1129 07/09/12 1250   07/09/12 1130   gentamicin (GARAMYCIN) 284 mg in dextrose 5 % 100 mL IVPB        5 mg/kg  56.8 kg (Adjusted) 107.1 mL/hr over 60 Minutes Intravenous On call to O.R. 07/09/12 1129 07/09/12 1218        .  They were given sequential compression devices, early ambulation, and chemoprophylaxis for DVT prophylaxis.  She did well post-operatively.  Her Hgb was stable.  She had good urine output.   She was tolerating po and voiding without difficulty.  Pain was controlled.  She benefited maximally from their hospital stay and there were no complications.    Recent vital signs:  Filed Vitals:   07/10/12 0534  BP: 104/74  Pulse: 64  Temp: 97.6 F (36.4 C)  Resp: 15    Recent laboratory studies:  Results for orders placed during the hospital encounter of 07/09/12  CBC      Component Value Range   WBC 13.9 (*) 4.0 - 10.5 K/uL   RBC 3.77 (*) 3.87 - 5.11 MIL/uL   Hemoglobin 12.3  12.0 - 15.0 g/dL   HCT 40.9  81.1 - 91.4 %   MCV 98.1  78.0 - 100.0 fL   MCH 32.6  26.0 - 34.0 pg   MCHC 33.2  30.0 - 36.0 g/dL   RDW 78.2  95.6 - 21.3 %   Platelets 215  150 - 400 K/uL    Discharge Medications:     Medication List     As of 07/10/2012  9:23 AM    STOP taking these medications         ferrous sulfate 325 (65 FE) MG tablet      megestrol 40 MG tablet   Commonly known as:  MEGACE      TAKE these medications         doxycycline 50 MG capsule   Commonly known as: VIBRAMYCIN   Take 1 capsule (50 mg total) by mouth 1 day or 1 dose.      oxyCODONE-acetaminophen 5-325 MG per tablet   Commonly known as: PERCOCET/ROXICET   Take 1-2 tablets by mouth every 6 (six) hours as needed (moderate to severe pain (when tolerating fluids)).        Diagnostic Studies: No results found.  Disposition: 01-Home or Self Care        Follow-up Information    Follow up with [facility]. In 2 weeks. Darl Pikes will call you with an appt.)    Contact information:   [address] [place] [address] [phone]          Signed: Reva Bores 07/10/2012, 9:23 AM

## 2012-07-10 NOTE — Progress Notes (Signed)
1 Day Post-Op Procedure(s) (LRB): HYSTERECTOMY VAGINAL (N/A) SALPINGO OOPHORECTOMY (Bilateral)  Subjective: Patient reports tolerating PO and no problems voiding.    Objective: I have reviewed patient's vital signs, intake and output and labs.  General: alert, cooperative and appears stated age GI: soft, non-tender; bowel sounds normal; no masses,  no organomegaly Extremities: extremities normal, atraumatic, no cyanosis or edema  Assessment: s/p Procedure(s) (LRB) with comments: HYSTERECTOMY VAGINAL (N/A) SALPINGO OOPHORECTOMY (Bilateral): stable  Plan: Encourage ambulation Discontinue IV fluids Discharge home  LOS: 1 day    Lissandro Dilorenzo S 07/10/2012, 9:19 AM

## 2012-07-10 NOTE — Progress Notes (Signed)
UR completed 

## 2012-07-10 NOTE — Progress Notes (Signed)
Pt discharged to home with daughter.  Condition stable.  Pt to car via wheelchair with E. Pinion, NT.  No equipment for home ordered at discharge.

## 2012-07-10 NOTE — Progress Notes (Signed)
D/C vaginal packing with moderate amount of serosanguinous drainage.

## 2012-07-30 ENCOUNTER — Ambulatory Visit (INDEPENDENT_AMBULATORY_CARE_PROVIDER_SITE_OTHER): Payer: Self-pay | Admitting: Family Medicine

## 2012-07-30 ENCOUNTER — Encounter: Payer: Self-pay | Admitting: Family Medicine

## 2012-07-30 VITALS — BP 131/96 | HR 83 | Ht 61.0 in | Wt 147.0 lb

## 2012-07-30 DIAGNOSIS — Z09 Encounter for follow-up examination after completed treatment for conditions other than malignant neoplasm: Secondary | ICD-10-CM

## 2012-07-30 NOTE — Patient Instructions (Signed)
Hysterectomy Information   A hysterectomy is a procedure where your uterus is surgically removed. It will no longer be possible to have menstrual periods or to become pregnant. The tubes and ovaries can be removed (bilateral salpingo-oopherectomy) during this surgery as well.    REASONS FOR A HYSTERECTOMY  · Persistent, abnormal bleeding.  · Lasting (chronic) pelvic pain or infection.  · The lining of the uterus (endometrium) starts growing outside the uterus (endometriosis).  · The endometrium starts growing in the muscle of the uterus (adenomyosis).  · The uterus falls down into the vagina (pelvic organ prolapse).  · Symptomatic uterine fibroids.  · Precancerous cells.  · Cervical cancer or uterine cancer.  TYPES OF HYSTERECTOMIES  · Supracervical hysterectomy. This type removes the top part of the uterus, but not the cervix.  · Total hysterectomy. This type removes the uterus and cervix.  · Radical hysterectomy. This type removes the uterus, cervix, and the fibrous tissue that holds the uterus in place in the pelvis (parametrium).  WAYS A HYSTERECTOMY CAN BE PERFORMED  · Abdominal hysterectomy. A large surgical cut (incision) is made in the abdomen. The uterus is removed through this incision.  · Vaginal hysterectomy. An incision is made in the vagina. The uterus is removed through this incision. There are no abdominal incisions.  · Conventional laparoscopic hysterectomy. A thin, lighted tube with a camera (laparoscope) is inserted into 3 or 4 small incisions in the abdomen. The uterus is cut into small pieces. The small pieces are removed through the incisions, or they are removed through the vagina.  · Laparoscopic assisted vaginal hysterectomy (LAVH). Three or four small incisions are made in the abdomen. Part of the surgery is performed laparoscopically and part vaginally. The uterus is removed through the vagina.  · Robot-assisted laparoscopic hysterectomy. A laparoscope is inserted into 3 or 4 small  incisions in the abdomen. A computer-controlled device is used to give the surgeon a 3D image. This allows for more precise movements of surgical instruments. The uterus is cut into small pieces and removed through the incisions or removed through the vagina.  RISKS OF HYSTERECTOMY    · Bleeding and risk of blood transfusion. Tell your caregiver if you do not want to receive any blood products.  · Blood clots in the legs or lung.  · Infection.  · Injury to surrounding organs.  · Anesthesia problems or side effects.  · Conversion to an abdominal hysterectomy.  WHAT TO EXPECT AFTER A HYSTERECTOMY  · You will be given pain medicine.  · You will need to have someone with you for the first 3 to 5 days after you go home.  · You will need to follow up with your surgeon in 2 to 4 weeks after surgery to evaluate your progress.  · You may have early menopause symptoms like hot flashes, night sweats, and insomnia.  · If you had a hysterectomy for a problem that was not a cancer or a condition that could lead to cancer, then you no longer need Pap tests. However, even if you no longer need a Pap test, a regular exam is a good idea to make sure no other problems are starting.  Document Released: 12/07/2000 Document Revised: 09/05/2011 Document Reviewed: 01/22/2011  [employer]® Patient Information ©2013 [employer].

## 2012-07-31 DIAGNOSIS — Z09 Encounter for follow-up examination after completed treatment for conditions other than malignant neoplasm: Secondary | ICD-10-CM | POA: Insufficient documentation

## 2012-07-31 NOTE — Assessment & Plan Note (Signed)
Doing well--continue current care and will see her back in 3 wks for final postop visit.

## 2012-07-31 NOTE — Progress Notes (Signed)
  Subjective:    Patient ID: Nancy Fields, female    DOB: [birth year], 57 y.o.   MRN: [id number]  HPI  Doing well.  3 wks s/p TVH/BSO.  Path reveals complex hyperplasia with atypia, adenomyosis and endometriosis.  Pt. Reports leakage of fluid that she thinks is urine occasionally with standing.  Not with valsalva and not with urge.  She has not required pain medication for a week.  Review of Systems  Constitutional: Negative for fever, chills and fatigue.  Respiratory: Negative for shortness of breath and wheezing.   Cardiovascular: Negative for chest pain.  Genitourinary: Negative for dysuria, hematuria and vaginal bleeding.       Objective:   Physical Exam  Vitals reviewed. Constitutional: She appears well-developed and well-nourished.  HENT:  Head: Normocephalic and atraumatic.  Neck: Neck supple.  Cardiovascular: Normal rate and regular rhythm.   Pulmonary/Chest: Effort normal.  Abdominal: Soft. There is no tenderness.  Genitourinary: There is no rash, tenderness or lesion on the right labia. There is no rash, tenderness or lesion on the left labia.       There are sutures visible at the cuff.  There is minimal discharge.  The bladder appears to be intact.  There is no leakage of urine noted with valsalva during exam. There is no mass at the cuff.          Assessment & Plan:

## 2012-08-21 ENCOUNTER — Ambulatory Visit (INDEPENDENT_AMBULATORY_CARE_PROVIDER_SITE_OTHER): Payer: Self-pay | Admitting: Family Medicine

## 2012-08-21 VITALS — BP 139/90 | HR 75 | Ht 61.0 in | Wt 147.4 lb

## 2012-08-21 DIAGNOSIS — Z09 Encounter for follow-up examination after completed treatment for conditions other than malignant neoplasm: Secondary | ICD-10-CM

## 2012-08-21 NOTE — Patient Instructions (Addendum)
Cataract A cataract is a clouding of the lens of the eye. When a lens becomes cloudy, vision is reduced based on the degree and nature of the clouding. Many cataracts reduce vision to some degree. Some cataracts make people more near-sighted as they develop. Other cataracts increase glare. Cataracts that are ignored and become worse can sometimes look white. The white color can be seen through the pupil. CAUSES   Aging. However, cataracts may occur at any age, even in newborns.  Certain drugs.  Trauma to the eye.  Certain diseases such as diabetes.  Specific eye diseases such as chronic inflammation inside the eye or a sudden attack of a rare form of glaucoma.  Inherited or acquired medical problems. SYMPTOMS   Gradual, progressive drop in vision in the affected eye.  Severe, rapid visual loss. This most often happens when trauma is the cause. DIAGNOSIS  To detect a cataract, an eye doctor examines the lens. Cataracts are best diagnosed with an exam of the eyes with the pupils enlarged (dilated) by drops.  TREATMENT  For an early cataract, vision may improve by using different eyeglasses or stronger lighting. If that does not help your vision, surgery is the only effective treatment. A cataract needs to be surgically removed when vision loss interferes with your everyday activities, such as driving, reading, or watching TV. A cataract may also have to be removed if it prevents examination or treatment of another eye problem. Surgery removes the cloudy lens and usually replaces it with a substitute lens (intraocular lens, IOL).  At a time when both you and your doctor agree, the cataract will be surgically removed. If you have cataracts in both eyes, only one is usually removed at a time. This allows the operated eye to heal and be out of danger from any possible problems after surgery (such as infection or poor wound healing). In rare cases, a cataract may be doing damage to your eye. In  these cases, your caregiver may advise surgical removal right away. The vast majority of people who have cataract surgery have better vision afterward. HOME CARE INSTRUCTIONS  If you are not planning surgery, you may be asked to do the following:  Use different eyeglasses.  Use stronger or brighter lighting.  Ask your eye doctor about reducing your medicine dose or changing medicines if it is thought that a medicine caused your cataract. Changing medicines does not make the cataract go away on its own.  Become familiar with your surroundings. Poor vision can lead to injury. Avoid bumping into things on the affected side. You are at a higher risk for tripping or falling.  Exercise extreme care when driving or operating machinery.  Wear sunglasses if you are sensitive to bright light or experiencing problems with glare. SEEK IMMEDIATE MEDICAL CARE IF:   You have a worsening or sudden vision loss.  You notice redness, swelling, or increasing pain in the eye.  You have a fever. Document Released: 06/13/2005 Document Revised: 09/05/2011 Document Reviewed: 02/04/2011 [facility] Patient Information 2013 [place], [place]. Hiatal Hernia A hiatal hernia occurs when a part of the stomach slides above the diaphragm. The diaphragm is the thin muscle separating the belly (abdomen) from the chest. A hiatal hernia can be something you are born with or develop over time. Hiatal hernias may allow stomach acid to flow back into your esophagus, the tube which carries food from your mouth to your stomach. If this acid causes problems it is called GERD (gastro-esophageal reflux disease).  SYMPTOMS  Common symptoms of GERD are heartburn (burning in your chest). This is worse when lying down or bending over. It may also cause belching and indigestion. Some of the things which make GERD worse are:  Increased weight pushes on stomach making acid rise more easily.  Smoking markedly increases acid  production.  Alcohol decreases lower esophageal sphincter pressure (valve between stomach and esophagus), allowing acid from stomach into esophagus.  Late evening meals and going to bed with a full stomach increases pressure.  Anything that causes an increase in acid production.  Lower esophageal sphincter incompetence. DIAGNOSIS  Hiatal hernia is often diagnosed with x-rays of your stomach and small bowel. This is called an UGI (upper gastrointestinal x-ray). Sometimes a gastroscopic procedure is done. This is a procedure where your caregiver uses a flexible instrument to look into the stomach and small bowel. HOME CARE INSTRUCTIONS   Try to achieve and maintain an ideal body weight.  Avoid drinking alcoholic beverages.  Stop smoking.  Put the head of your bed on 4 to 6 inch blocks. This will keep your head and esophagus higher than your stomach. If you cannot use blocks, sleep with several pillows under your head and shoulders.  Over-the-counter medications will decrease acid production. Your caregiver can also prescribe medications for this. Take as directed.  1/2 to 1 teaspoon of an antacid taken every hour while awake, with meals and at bedtime, will neutralize acid.  Do not take aspirin, ibuprofen (Advil or Motrin), or other nonsteroidal anti-inflammatory drugs.  Do not wear tight clothing around your chest or stomach.  Eat smaller meals and eat more frequently. This keeps your stomach from getting too full. Eat slowly.  Do not lie down for 2 or 3 hours after eating. Do not eat or drink anything 1 to 2 hours before going to bed.  Avoid caffeine beverages (colas, coffee, cocoa, tea), fatty foods, citrus fruits and all other foods and drinks that contain acid and that seem to increase the problems.  Avoid bending over, especially after eating. Also avoid straining during bowel movements or when urinating or lifting things. Anything that increases the pressure in your belly  increases the amount of acid that may be pushed up into your esophagus. SEEK IMMEDIATE MEDICAL CARE IF:  There is change in location (pain in arms, neck, jaw, teeth or back) of your pain, or the pain is getting worse.  You also experience nausea, vomiting, sweating (diaphoresis), or shortness of breath.  You develop continual vomiting, vomit blood or coffee ground material, have bright red blood in your stools, or have black tarry stools. Some of these symptoms could signal other problems such as heart disease. MAKE SURE YOU:   Understand these instructions.  Monitor your condition.  Contact your caregiver if you are not doing well or are getting worse. Document Released: 09/03/2003 Document Revised: 09/05/2011 Document Reviewed: 06/13/2005 [facility] Patient Information 2013 [place], [place]. Rosacea Rosacea is a long-term (chronic) condition that affects the skin of the face (cheeks, nose, brow, and chin) and sometimes the eyes. Rosacea causes the blood vessels near the surface of the skin to enlarge, resulting in redness. This condition usually begins after age 44. It occurs most often in light-skinned women. Without treatment, rosacea tends to get worse over time. There is no cure for rosacea, but treatment can help control your symptoms. CAUSES  The cause is unknown. It is thought that some people may inherit a tendency to develop rosacea. Certain triggers can make  your rosacea worse, including:  Hot baths.  Exercise.  Sunlight.  Very hot or cold temperatures.  Hot or spicy foods and drinks.  Drinking alcohol.  Stress.  Taking blood pressure medicine.  Long-term use of topical steroids on the face. SYMPTOMS   Redness of the face.  Red bumps or pimples on the face.  Red, enlarged nose (rhinophyma).  Blushing easily.  Red lines on the skin.  Irritated or burning feeling in the eyes.  Swollen eyelids. DIAGNOSIS  Your caregiver can usually tell what is wrong by  asking about your symptoms and performing a physical exam. TREATMENT  Avoiding triggers is an important part of treatment. You will also need to see a skin specialist (dermatologist) who can develop a treatment plan for you. The goals of treatment are to control your condition and to improve the appearance of your skin. It may take several weeks or months of treatment before you notice an improvement in your skin. Even after your skin improves, you will likely need to continue treatment to prevent your rosacea from coming back. Treatment methods may include:  Using sunscreen or sunblock daily to protect the skin.  Antibiotic medicine, such as metronidazole, applied directly to the skin.  Antibiotics taken by mouth. This is usually prescribed if you have eye problems from your rosacea.  Laser surgery to improve the appearance of the skin. This surgery can reduce the appearance of red lines on the skin and can remove excess tissue from the nose to reduce its size. HOME CARE INSTRUCTIONS  Avoid things that seem to trigger your flare-ups.  If you are given antibiotics, take them as directed. Finish them even if you start to feel better.  Use a gentle facial cleanser that does not contain alcohol.  You may use a mild facial moisturizer.  Use a sunscreen or sunblock with SPF 30 or greater.  Wear a green-tinted foundation powder to conceal redness, if needed. Choose cosmetics that are noncomedogenic. This means they do not block your pores.  If your eyelids are affected, apply warm compresses to the eyelids. Do this up to 4 times a day or as directed by your caregiver. SEEK MEDICAL CARE IF:  Your skin problems get worse.  You feel depressed.  You lose your appetite.  You have trouble concentrating.  You have problems with your eyes, such as redness or itching. MAKE SURE YOU:  Understand these instructions.  Will watch your condition.  Will get help right away if you are not doing  well or get worse. Document Released: 07/21/2004 Document Revised: 12/13/2011 Document Reviewed: 05/24/2011 [facility] Patient Information 2013 [place], [place].

## 2012-08-21 NOTE — Progress Notes (Signed)
  Subjective:    Patient ID: Nancy Fields, female    DOB: April 20, 1956, 57 y.o.   MRN: [id number]  HPI  1.  No longer leaking over last one week. 2.  Pain in mid-gastrum has resolved 3.  Substernal CP while eating lunch.  Lasted < 1 min, resolved on its own.  No sweating, diaphoresis, no SOB.  Did not radiate.     Review of Systems  Constitutional: Negative for fever and appetite change.  Respiratory: Negative for shortness of breath.   Genitourinary: Negative for dysuria, hematuria and vaginal pain.       Objective:   Physical Exam  Vitals reviewed. Constitutional: She appears well-developed and well-nourished.  HENT:  Head: Normocephalic and atraumatic.  Eyes: No scleral icterus.  Neck: Neck supple.  Abdominal: Soft. There is no tenderness.          Assessment & Plan:

## 2012-08-21 NOTE — Progress Notes (Signed)
Here today for routine post-op check.

## 2012-08-22 ENCOUNTER — Encounter: Payer: Self-pay | Admitting: Family Medicine

## 2012-08-22 NOTE — Assessment & Plan Note (Signed)
Doing very well--Will release.

## 2012-11-01 IMAGING — US US TRANSVAGINAL NON-OB
1 series · 13 of 25 positions shown · non-contrast
Comparison: 12/14/2010.

CLINICAL DATA: Bleeding.



[Series 1: us pelvis complete · 13 of 48 slices shown]
[im 1/48]
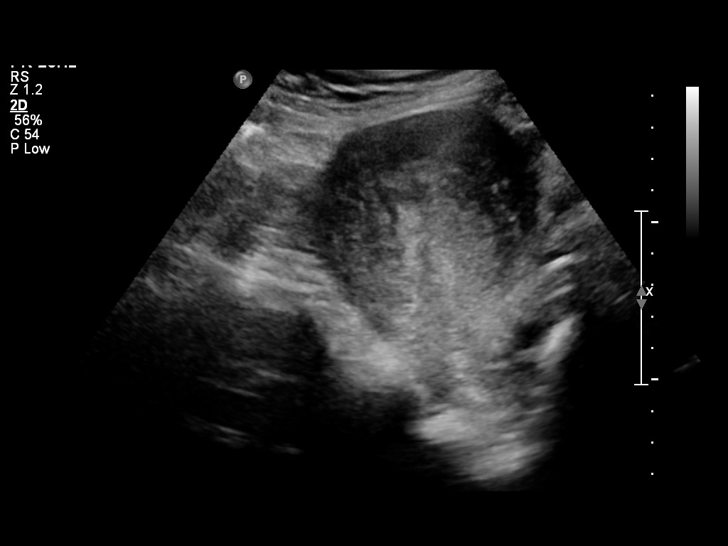
[im 4/48]
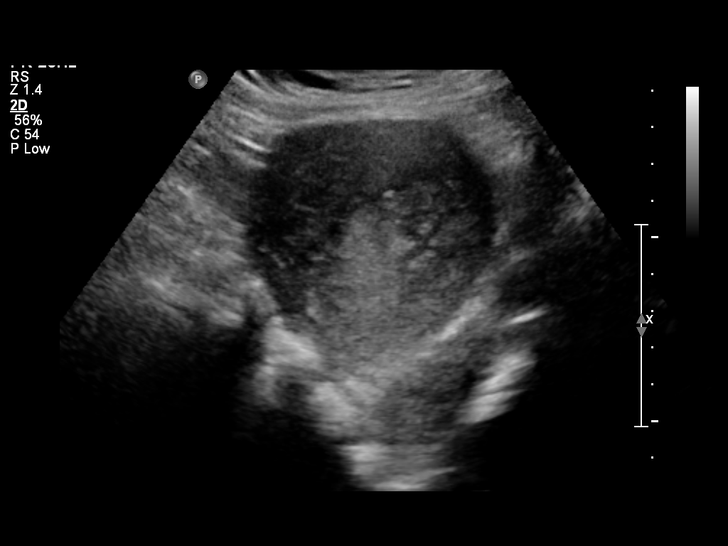
[im 8/48]
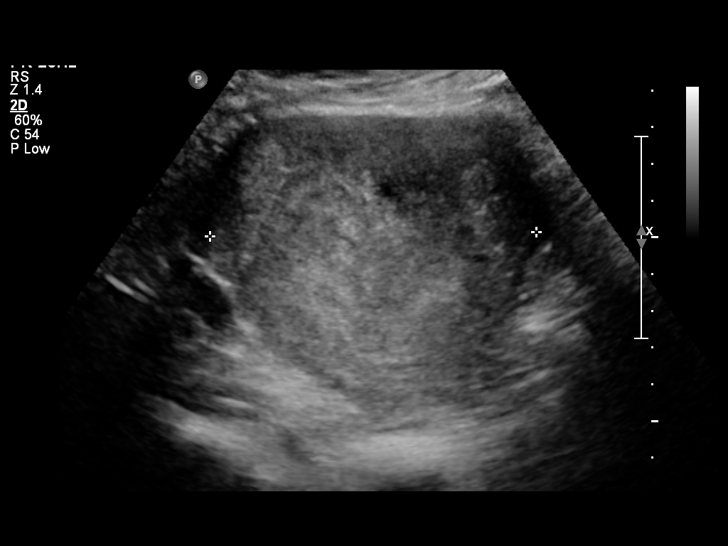
[im 12/48]
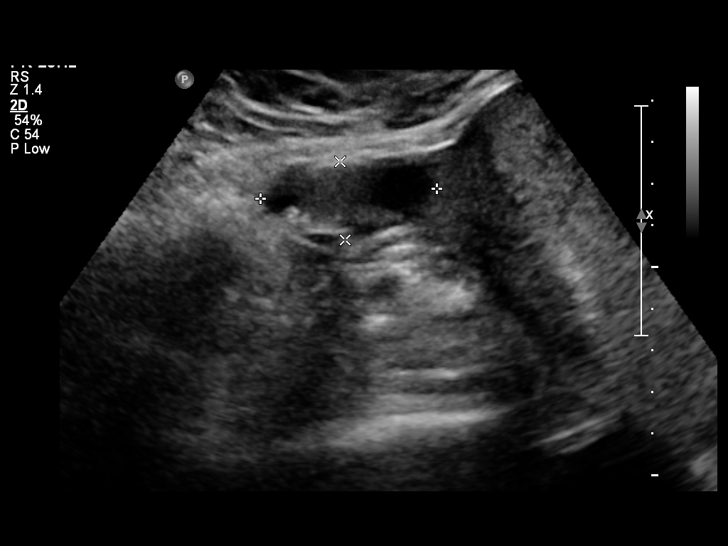
[im 16/48]
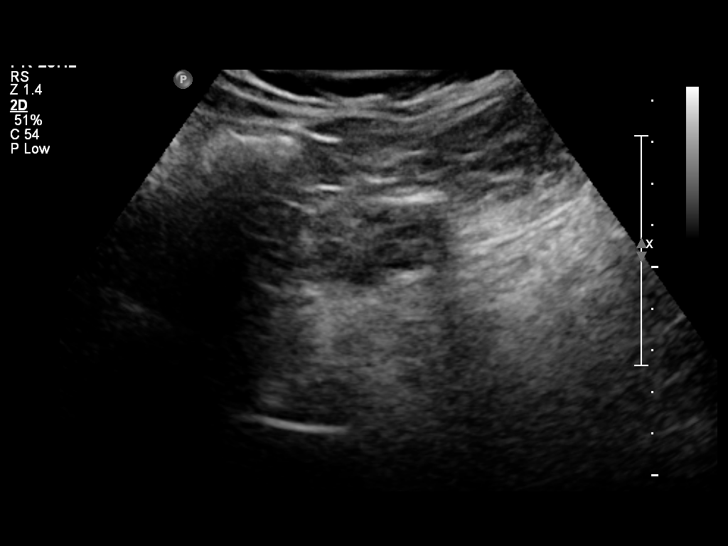
[im 20/48]
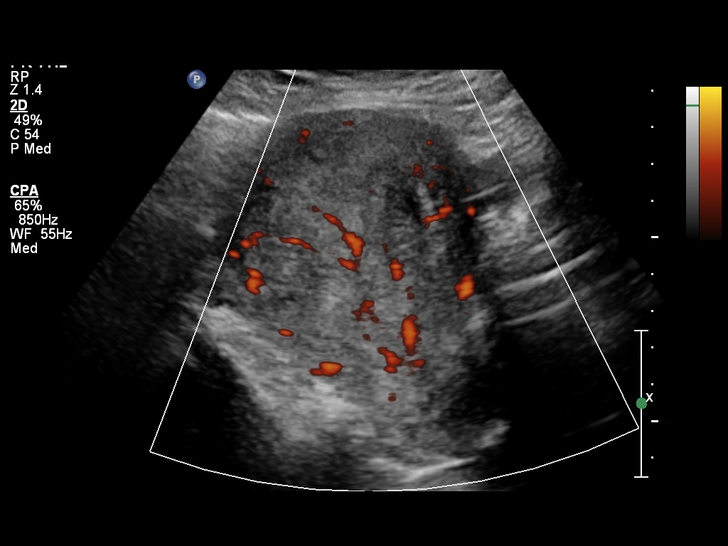
[im 24/48]
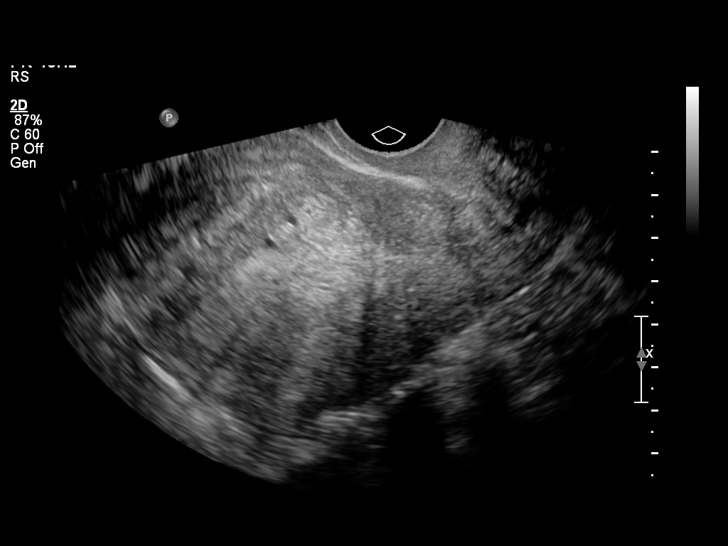
[im 28/48]
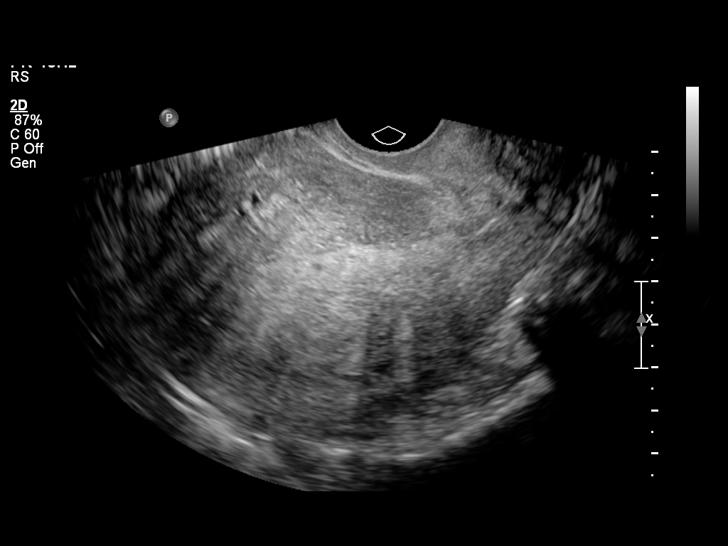
[im 32/48]
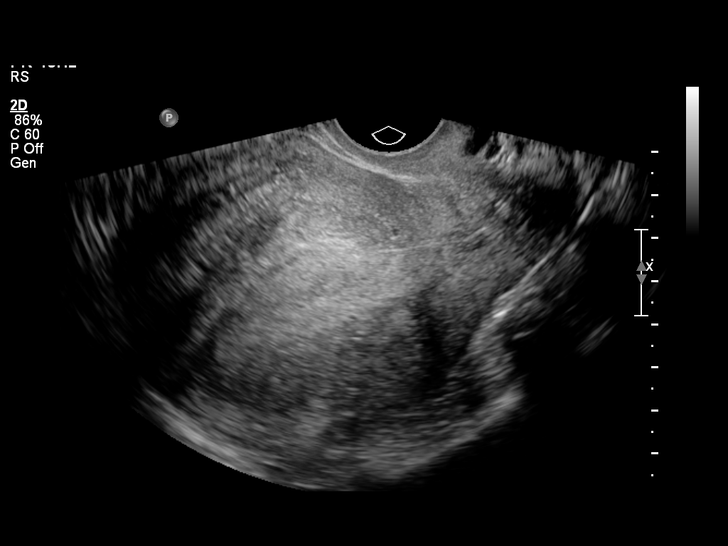
[im 36/48]
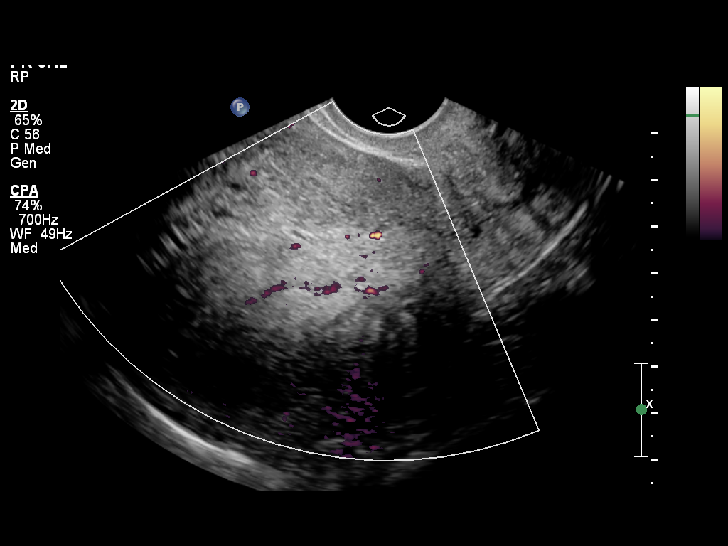
[im 40/48]
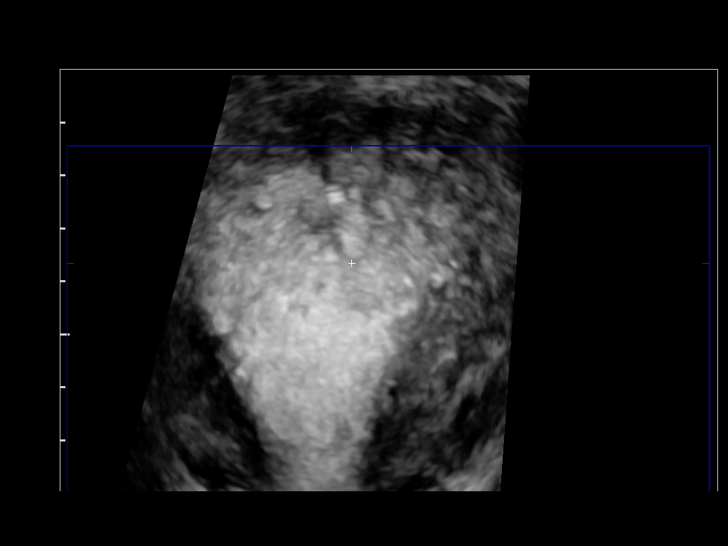
[im 44/48]
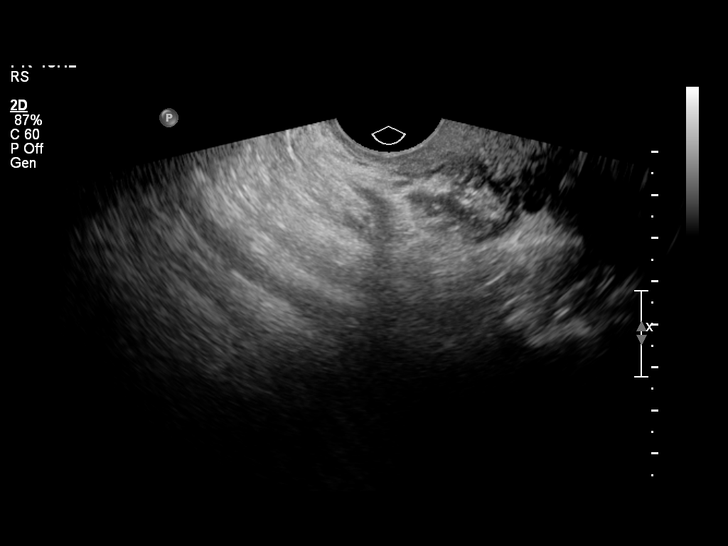
[im 48/48]
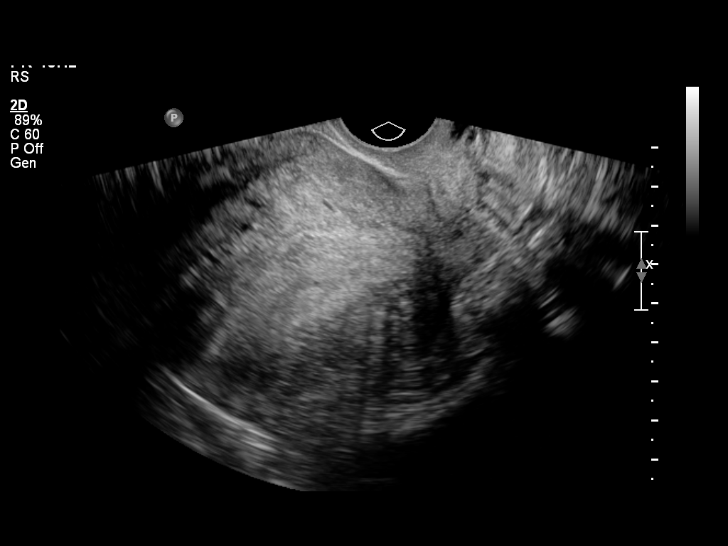

[13 of 25 positions shown; findings below may reference images not displayed]

FINDINGS: Uterus: 11.7 x 7.1 x 9.1 cm.  Heterogeneous echotexture with
scattered cystic changes.

Endometrium: Difficult to delineate an appearing thick and
measuring up to36.6 mm.

Right ovary:  4.2 x 1.9 x 2.2 cm.  No dominant worrisome mass.

Left ovary: 3.2 x 1.9 x 1.7 cm. No dominant worrisome mass.

Other findings: No free fluid
IMPRESSION: Heterogeneous appearance of the uterus with scattered cystic
changes most notable frontal region. This may represent changes of
adenomyosis as previously discussed.

Difficult to assess the endometrioma appearing thickened measuring
up to 16.5 mm.

Given the patient's age and clinical history with the appearance of
the endometrium, tumor not excluded.  This will require follow-up..

## 2014-04-28 ENCOUNTER — Encounter: Payer: Self-pay | Admitting: Family Medicine

## 2016-11-22 ENCOUNTER — Ambulatory Visit: Payer: Self-pay | Admitting: Family Medicine

## 2016-11-22 ENCOUNTER — Encounter: Payer: Self-pay | Admitting: Family Medicine

## 2016-11-22 DIAGNOSIS — Z01419 Encounter for gynecological examination (general) (routine) without abnormal findings: Secondary | ICD-10-CM

## 2016-11-22 MED ORDER — PERMETHRIN 5 % EX CREA: 1.0000 "application " | TOPICAL_CREAM | Freq: Once | CUTANEOUS | 0 refills | Status: AC

## 2016-11-22 NOTE — Progress Notes (Signed)
  Subjective:     Nancy Fields is a 61 y.o. female and is here for a comprehensive physical exam. The patient reports problems - no insurance and no MD since surgery.. Feels like there is a bug crawling up inside her vagina for about 1 year. 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 Does lose her urine when she hears running water and sometimes when she coughs.  Social History   Social History  . Marital status: Divorced    Spouse name: N/A  . Number of children: N/A  . Years of education: N/A   Occupational History  . Not on file.   Social History Main Topics  . Smoking status: Never Smoker  . Smokeless tobacco: Never Used  . Alcohol use Yes     Comment: social  . Drug use: No  . Sexual activity: No   Other Topics Concern  . Not on file   Social History Narrative  . No narrative on file   Health Maintenance  Topic Date Due  . Hepatitis C Screening  10-25-1955  . HIV Screening  06/30/1969  . TETANUS/TDAP  06/30/1973  . MAMMOGRAM  06/30/2004  . COLONOSCOPY  06/30/2004  . PAP SMEAR  01/09/2015  . INFLUENZA VACCINE  01/25/2017    The following portions of the patient's history were reviewed and updated as appropriate: allergies, current medications, past family history, past medical history, past social history, past surgical history and problem list.  Review of Systems A comprehensive review of systems was negative except for: Cardiovascular: positive for chest pain, chest pressure/discomfort and dyspnea Genitourinary: positive for urinary leakage   Objective:    BP 117/84 (BP Location: Left Arm, Patient Position: Sitting, Cuff Size: Large)   Pulse 91   Resp 20   Ht 5' 1.25" (1.556 m)   Wt 178 lb (80.7 kg)   LMP 02/24/2012   BMI 33.36 kg/m  General appearance: alert, cooperative and appears stated age Head: Normocephalic, without obvious abnormality, atraumatic Neck: supple, symmetrical, trachea midline and thyroid not enlarged, symmetric, no  tenderness/mass/nodules Lungs: clear to auscultation bilaterally Breasts: normal appearance, no masses or tenderness Heart: regular rate and rhythm and systolic murmur: holosystolic 2/6, blowing at 2nd left intercostal space Abdomen: soft, non-tender; bowel sounds normal; no masses,  no organomegaly Pelvic: cervix normal in appearance, external genitalia normal, no adnexal masses or tenderness, uterus surgically absent and vagina normal without discharge Extremities: extremities normal, atraumatic, no cyanosis or edema Pulses: 2+ and symmetric Skin: Skin color, texture, turgor normal. No rashes or lesions Lymph nodes: Cervical, supraclavicular, and axillary nodes normal. Neurologic: Grossly normal    Assessment:    GYN female exam.      Plan:      Problem List Items Addressed This Visit    None    Visit Diagnoses    Encounter for gynecological examination without abnormal finding       Relevant Orders   MM DIGITAL SCREENING BILATERAL   Ambulatory referral to [facility]     See PCP--will need f/u of aortic stenosis at some point Last ECHO ion 2014 with mild-->moderate AS on that scan. May be having symptoms currently. No pap needed due to hysterectomy See After Visit Summary for Counseling Recommendations

## 2016-11-22 NOTE — Patient Instructions (Signed)

## 2016-11-23 ENCOUNTER — Telehealth: Payer: Self-pay | Admitting: Radiology

## 2016-11-23 NOTE — Telephone Encounter (Signed)
Left voicemail for patient to call [facility] so that I can give her an appointment that I scheduled for her @ [facility] 12/09/16 @ 1:30

## 2016-11-30 ENCOUNTER — Other Ambulatory Visit: Payer: Self-pay | Admitting: Family Medicine

## 2016-11-30 DIAGNOSIS — Z1231 Encounter for screening mammogram for malignant neoplasm of breast: Secondary | ICD-10-CM

## 2016-12-09 ENCOUNTER — Ambulatory Visit: Payer: Self-pay | Attending: Internal Medicine | Admitting: Internal Medicine

## 2016-12-09 ENCOUNTER — Encounter: Payer: Self-pay | Admitting: Internal Medicine

## 2016-12-09 VITALS — BP 127/88 | HR 76 | Temp 98.1°F | Resp 17 | Ht 61.5 in | Wt 178.0 lb

## 2016-12-09 DIAGNOSIS — R1011 Right upper quadrant pain: Secondary | ICD-10-CM | POA: Insufficient documentation

## 2016-12-09 DIAGNOSIS — Z1159 Encounter for screening for other viral diseases: Secondary | ICD-10-CM | POA: Insufficient documentation

## 2016-12-09 DIAGNOSIS — R011 Cardiac murmur, unspecified: Secondary | ICD-10-CM

## 2016-12-09 DIAGNOSIS — K219 Gastro-esophageal reflux disease without esophagitis: Secondary | ICD-10-CM | POA: Insufficient documentation

## 2016-12-09 DIAGNOSIS — K648 Other hemorrhoids: Secondary | ICD-10-CM | POA: Insufficient documentation

## 2016-12-09 DIAGNOSIS — R6889 Other general symptoms and signs: Secondary | ICD-10-CM | POA: Insufficient documentation

## 2016-12-09 NOTE — Patient Instructions (Addendum)
Try to keep your bowel movements soft and regular. He can purchase MiraLAX or Colace over-the-counter. I recommend doing sitz bath daily for the next 1 week.    Hemorrhoids Hemorrhoids are swollen veins in and around the rectum or anus. There are two types of hemorrhoids:  Internal hemorrhoids. These occur in the veins that are just inside the rectum. They may poke through to the outside and become irritated and painful.  External hemorrhoids. These occur in the veins that are outside of the anus and can be felt as a painful swelling or hard lump near the anus.  Most hemorrhoids do not cause serious problems, and they can be managed with home treatments such as diet and lifestyle changes. If home treatments do not help your symptoms, procedures can be done to shrink or remove the hemorrhoids. What are the causes? This condition is caused by increased pressure in the anal area. This pressure may result from various things, including:  Constipation.  Straining to have a bowel movement.  Diarrhea.  Pregnancy.  Obesity.  Sitting for long periods of time.  Heavy lifting or other activity that causes you to strain.  Anal sex.  What are the signs or symptoms? Symptoms of this condition include:  Pain.  Anal itching or irritation.  Rectal bleeding.  Leakage of stool (feces).  Anal swelling.  One or more lumps around the anus.  How is this diagnosed? This condition can often be diagnosed through a visual exam. Other exams or tests may also be done, such as:  Examination of the rectal area with a gloved hand (digital rectal exam).  Examination of the anal canal using a small tube (anoscope).  A blood test, if you have lost a significant amount of blood.  A test to look inside the colon (sigmoidoscopy or colonoscopy).  How is this treated? This condition can usually be treated at home. However, various procedures may be done if dietary changes, lifestyle changes, and  other home treatments do not help your symptoms. These procedures can help make the hemorrhoids smaller or remove them completely. Some of these procedures involve surgery, and others do not. Common procedures include:  Rubber band ligation. Rubber bands are placed at the base of the hemorrhoids to cut off the blood supply to them.  Sclerotherapy. Medicine is injected into the hemorrhoids to shrink them.  Infrared coagulation. A type of light energy is used to get rid of the hemorrhoids.  Hemorrhoidectomy surgery. The hemorrhoids are surgically removed, and the veins that supply them are tied off.  Stapled hemorrhoidopexy surgery. A circular stapling device is used to remove the hemorrhoids and use staples to cut off the blood supply to them.  Follow these instructions at home: Eating and drinking  Eat foods that have a lot of fiber in them, such as whole grains, beans, nuts, fruits, and vegetables. Ask your health care provider about taking products that have added fiber (fiber supplements).  Drink enough fluid to keep your urine clear or pale yellow. Managing pain and swelling  Take warm sitz baths for 20 minutes, 3-4 times a day to ease pain and discomfort.  If directed, apply ice to the affected area. Using ice packs between sitz baths may be helpful. ? Put ice in a plastic bag. ? Place a towel between your skin and the bag. ? Leave the ice on for 20 minutes, 2-3 times a day. General instructions  Take over-the-counter and prescription medicines only as told by your health care  provider.  Use medicated creams or suppositories as told.  Exercise regularly.  Go to the bathroom when you have the urge to have a bowel movement. Do not wait.  Avoid straining to have bowel movements.  Keep the anal area dry and clean. Use wet toilet paper or moist towelettes after a bowel movement.  Do not sit on the toilet for long periods of time. This increases blood pooling and pain. Contact  a health care provider if:  You have increasing pain and swelling that are not controlled by treatment or medicine.  You have uncontrolled bleeding.  You have difficulty having a bowel movement, or you are unable to have a bowel movement.  You have pain or inflammation outside the area of the hemorrhoids. This information is not intended to replace advice given to you by your health care provider. Make sure you discuss any questions you have with your health care provider. Document Released: 06/10/2000 Document Revised: 11/11/2015 Document Reviewed: 02/25/2015 Elsevier Interactive Patient Education  2017 [employer].

## 2016-12-09 NOTE — Progress Notes (Signed)
Patient ID: Nancy Fields, female    DOB: 18-Apr-1956  MRN: [id number]  CC: Establish Care   Subjective: Nancy Fields is a 61 y.o. female who presents for new pt visit Her concerns today include:  No primary physician due to lack of insurance.  Saw her GYN, Dr. Kennon Rounds recently 10/2016. Told she will no longer need paps given hx of TAH in 2014. No previous chronic med problems.  1.  Urine "is very, very warm when I urinate and this has been going on for a long time" -no dysuria or foul odor -"I'm always hot" especially from below breast to pelvic area -she does not have air condition in her car or apartment.  Uses fan. Windows in car do not work  2. Sharp pains intermittently RU to mid abdominal quadrant x 6 mths -does not occur every day. last a few seconds. -no pain with meals, N/V -+hemorrhoids for yrs since last pregnancy 42 yrs ago.   Had hard BM today with a lot of bleeding. First time in a while, at least a yr Eats more fruits and veggies, bran cereal  to try keep BM regular -uses [employer]	[place] and OTC suppositories.  Usually shrinks after 3-4 days of use -had 2 colonoscopies. Last was 7-8 yrs ago  3.Had EGD 7-8 hrs ago. Dx with hiatial hernia. -gets heart burn with certain foods or if she eats late at night. -chew a few Alka-Seltzer tabs with good relief  4. Would like to be screened for hep C and B    Patient Active Problem List   Diagnosis Date Noted  . Aortic valve stenosis, moderate 04/10/2012  . Acne rosacea 03/26/2012     No current outpatient prescriptions on file prior to visit.   No current facility-administered medications on file prior to visit.     Allergies  Allergen Reactions  . Penicillins Hives  . Latex Rash    Social History   Social History  . Marital status: Divorced    Spouse name: N/A  . Number of children: N/A  . Years of education: N/A   Occupational History  . Not on file.   Social History Main Topics  . Smoking status: Never  Smoker  . Smokeless tobacco: Never Used  . Alcohol use Yes     Comment: social  . Drug use: No  . Sexual activity: No   Other Topics Concern  . Not on file   Social History Narrative  . No narrative on file    Family History  Problem Relation Age of Onset  . Cancer Mother 8       lung cancer  . Stroke Father   . Cancer Brother        skin cancer  . Cancer Maternal Aunt        stomach  . Stroke Maternal Grandmother   . Cancer Maternal Grandfather        colon    Past Surgical History:  Procedure Laterality Date  . ABCESS DRAINAGE    . APPENDECTOMY    . bilateral tubal ligation    . DILATION AND CURETTAGE OF UTERUS    . DILATION AND CURETTAGE OF UTERUS  06/22/2012   Procedure: DILATATION AND CURETTAGE;  Surgeon: Donnamae Jude, MD;  Location: [facility];  Service: Gynecology;  Laterality: N/A;  . SALPINGOOPHORECTOMY  07/09/2012   Procedure: SALPINGO OOPHORECTOMY;  Surgeon: Donnamae Jude, MD;  Location: [facility];  Service: Gynecology;  Laterality: Bilateral;  .  TUBAL LIGATION    . VAGINAL HYSTERECTOMY  07/09/2012   Procedure: HYSTERECTOMY VAGINAL;  Surgeon: Donnamae Jude, MD;  Location: [facility];  Service: Gynecology;  Laterality: N/A;  . WISDOM TOOTH EXTRACTION      ROS: Review of Systems  Constitutional: Negative for activity change, appetite change, fever and unexpected weight change.  Gastrointestinal: Positive for abdominal pain, blood in stool and constipation. Negative for abdominal distention, nausea and rectal pain.  Psychiatric/Behavioral: Negative for dysphoric mood and sleep disturbance. The patient is not nervous/anxious.     PHYSICAL EXAM: BP 127/88 (BP Location: Right Arm, Patient Position: Sitting, Cuff Size: Normal)   Pulse 76   Temp 98.1 F (36.7 C) (Oral)   Resp 17   Ht 5' 1.5" (1.562 m)   Wt 178 lb (80.7 kg)   LMP 02/24/2012   SpO2 98%   BMI 33.09 kg/m   Wt Readings from Last 3 Encounters:  12/09/16 178 lb (80.7 kg)  11/22/16 178 lb (80.7 kg)    08/21/12 147 lb 6 oz (66.8 kg)    Physical Exam  General appearance - alert, well appearing,  Older, obese, caucasian female and in no distress Mental status - alert, oriented to person, place, and time, normal mood, behavior, speech, dress, motor activity, and thought processes Eyes - pupils equal and reactive, extraocular eye movements intact Mouth - mucous membranes moist, pharynx normal without lesions Neck - supple, no significant adenopathy Chest - clear to auscultation, no wheezes, rales or rhonchi, symmetric air entry Heart - normal rate, regular rhythm, normal S1, S2. 2/6 SEM LT upper sternal border,  Abdomen - obese, soft, nontender.  Slight tenderness RT lower rib cage.  No enlargement of liver.  No masses Rectal - several large, fleshy hemorrhoids, one of which prophylaxis out of the rectum.. They're nontender to touch. Extremities - peripheral pulses normal, no pedal edema, no clubbing or cyanosis  Depression screen PHQ 2/9 12/09/2016  Decreased Interest 0  Down, Depressed, Hopeless 0  PHQ - 2 Score 0    ASSESSMENT AND PLAN: 1. Right upper quadrant abdominal pain -Nonspecific and does not sound like gallbladder or liver. Given that episodes only last a few seconds and does not occur every day, we will observe for now. - Urinalysis  2. Hemorrhoid prolapse -Advised referral to a general surgeon to have hemorrhoids removed. Patient declines at this time stating that she has dealt with them for 40 years. -Advised sitz baths several times a week. -She declines prescription for Anusol suppository. She is satisfied with over-the-counter suppository. -Encourage her to keep bowel movements soft and regular. She declines prescription for MiraLAX or Colace. She feels she is doing okay with diet. - CBC  3. Gastroesophageal reflux disease without esophagitis -GERD precautions discussed. -She declines prescription for ranitidine.  She states she does okay with the Alka-Seltzer -  Comprehensive metabolic panel - CBC - Lipid panel  4. Heat intolerance - TSH  5. Need for hepatitis C screening test - Hepatitis C Antibody  6. Need for hepatitis B screening test - Hepatitis B surface antigen  7. Heart murmur -AS on Echo in 2014.  We did not get to discuss today but will take up on next visit.   Pt had other issues she wanted to discuss but in the interest of time, we defer for next visit in 1 wk  Patient was given the opportunity to ask questions.  Patient verbalized understanding of the plan and was able to repeat key elements  of the plan.   Orders Placed This Encounter  Procedures  . Comprehensive metabolic panel  . CBC  . Lipid panel  . Hepatitis C Antibody  . Hepatitis B surface antigen  . TSH  . Urinalysis     Requested Prescriptions    No prescriptions requested or ordered in this encounter    Return in about 1 week (around 12/16/2016).  Karle Plumber, MD, FACP

## 2016-12-10 ENCOUNTER — Encounter: Payer: Self-pay | Admitting: Internal Medicine

## 2016-12-10 LAB — CBC
Hematocrit: 45.7 % (ref 34.0–46.6)
Hemoglobin: 15.7 g/dL (ref 11.1–15.9)
MCH: 32.4 pg (ref 26.6–33.0)
MCHC: 34.4 g/dL (ref 31.5–35.7)
MCV: 94 fL (ref 79–97)
Platelets: 265 x10E3/uL (ref 150–379)
RBC: 4.84 x10E6/uL (ref 3.77–5.28)
RDW: 13.3 % (ref 12.3–15.4)
WBC: 6.2 x10E3/uL (ref 3.4–10.8)

## 2016-12-10 LAB — COMPREHENSIVE METABOLIC PANEL WITH GFR
ALT: 16 IU/L (ref 0–32)
AST: 19 IU/L (ref 0–40)
Albumin/Globulin Ratio: 1.6 (ref 1.2–2.2)
Albumin: 4.6 g/dL (ref 3.6–4.8)
Alkaline Phosphatase: 83 IU/L (ref 39–117)
BUN/Creatinine Ratio: 22 (ref 12–28)
BUN: 14 mg/dL (ref 8–27)
Bilirubin Total: 0.6 mg/dL (ref 0.0–1.2)
CO2: 22 mmol/L (ref 20–29)
Calcium: 9.6 mg/dL (ref 8.7–10.3)
Chloride: 106 mmol/L (ref 96–106)
Creatinine, Ser: 0.63 mg/dL (ref 0.57–1.00)
GFR calc Af Amer: 111 mL/min/1.73
GFR calc non Af Amer: 96 mL/min/1.73
Globulin, Total: 2.8 g/dL (ref 1.5–4.5)
Glucose: 84 mg/dL (ref 65–99)
Potassium: 4.3 mmol/L (ref 3.5–5.2)
Sodium: 143 mmol/L (ref 134–144)
Total Protein: 7.4 g/dL (ref 6.0–8.5)

## 2016-12-10 LAB — URINALYSIS
BILIRUBIN UA: NEGATIVE
GLUCOSE, UA: NEGATIVE
Ketones, UA: NEGATIVE
Leukocytes, UA: NEGATIVE
Nitrite, UA: NEGATIVE
PROTEIN UA: NEGATIVE
Specific Gravity, UA: 1.015 (ref 1.005–1.030)
Urobilinogen, Ur: 0.2 mg/dL (ref 0.2–1.0)
pH, UA: 5 (ref 5.0–7.5)

## 2016-12-10 LAB — LIPID PANEL
Chol/HDL Ratio: 3.3 ratio (ref 0.0–4.4)
Cholesterol, Total: 284 mg/dL — ABNORMAL HIGH (ref 100–199)
HDL: 86 mg/dL (ref >39)
LDL Calculated: 169 mg/dL — ABNORMAL HIGH (ref 0–99)
Triglycerides: 147 mg/dL (ref 0–149)
VLDL CHOLESTEROL CAL: 29 mg/dL (ref 5–40)

## 2016-12-10 LAB — HEPATITIS C ANTIBODY

## 2016-12-10 LAB — TSH: TSH: 2.59 u[IU]/mL (ref 0.450–4.500)

## 2016-12-10 LAB — HEPATITIS B SURFACE ANTIGEN: HEP B S AG: NEGATIVE

## 2016-12-10 NOTE — Progress Notes (Signed)
Labs results reviewed.  Pt request yesterday on office visit that she not be called with results.  Would prefer to discuss on her f/u visit in person in one week. Results for orders placed or performed in visit on 12/09/16  Comprehensive metabolic panel  Result Value Ref Range   Glucose 84 65 - 99 mg/dL   BUN 14 8 - 27 mg/dL   Creatinine, Ser 0.63 0.57 - 1.00 mg/dL   GFR calc non Af Amer 96 >59 mL/min/1.73   GFR calc Af Amer 111 >59 mL/min/1.73   BUN/Creatinine Ratio 22 12 - 28   Sodium 143 134 - 144 mmol/L   Potassium 4.3 3.5 - 5.2 mmol/L   Chloride 106 96 - 106 mmol/L   CO2 22 20 - 29 mmol/L   Calcium 9.6 8.7 - 10.3 mg/dL   Total Protein 7.4 6.0 - 8.5 g/dL   Albumin 4.6 3.6 - 4.8 g/dL   Globulin, Total 2.8 1.5 - 4.5 g/dL   Albumin/Globulin Ratio 1.6 1.2 - 2.2   Bilirubin Total 0.6 0.0 - 1.2 mg/dL   Alkaline Phosphatase 83 39 - 117 IU/L   AST 19 0 - 40 IU/L   ALT 16 0 - 32 IU/L  CBC  Result Value Ref Range   WBC 6.2 3.4 - 10.8 x10E3/uL   RBC 4.84 3.77 - 5.28 x10E6/uL   Hemoglobin 15.7 11.1 - 15.9 g/dL   Hematocrit 45.7 34.0 - 46.6 %   MCV 94 79 - 97 fL   MCH 32.4 26.6 - 33.0 pg   MCHC 34.4 31.5 - 35.7 g/dL   RDW 13.3 12.3 - 15.4 %   Platelets 265 150 - 379 x10E3/uL  Lipid panel  Result Value Ref Range   Cholesterol, Total 284 (H) 100 - 199 mg/dL   Triglycerides 147 0 - 149 mg/dL   HDL 86 >39 mg/dL   VLDL Cholesterol Cal 29 5 - 40 mg/dL   LDL Calculated 169 (H) 0 - 99 mg/dL   Chol/HDL Ratio 3.3 0.0 - 4.4 ratio  Hepatitis C Antibody  Result Value Ref Range   Hep C Virus Ab <0.1 0.0 - 0.9 s/co ratio  Hepatitis B surface antigen  Result Value Ref Range   Hepatitis B Surface Ag Negative Negative  TSH  Result Value Ref Range   TSH 2.590 0.450 - 4.500 uIU/mL  Urinalysis  Result Value Ref Range   Specific Gravity, UA 1.015 1.005 - 1.030   pH, UA 5.0 5.0 - 7.5   Color, UA Yellow Yellow   Appearance Ur Clear Clear   Leukocytes, UA Negative Negative   Protein, UA  Negative Negative/Trace   Glucose, UA Negative Negative   Ketones, UA Negative Negative   RBC, UA 1+ (A) Negative   Bilirubin, UA Negative Negative   Urobilinogen, Ur 0.2 0.2 - 1.0 mg/dL   Nitrite, UA Negative Negative

## 2016-12-16 ENCOUNTER — Ambulatory Visit: Payer: Self-pay | Attending: Internal Medicine | Admitting: Internal Medicine

## 2016-12-16 ENCOUNTER — Ambulatory Visit (HOSPITAL_COMMUNITY)
Admission: RE | Admit: 2016-12-16 | Discharge: 2016-12-16 | Disposition: A | Discharge status: Home / Self Care | Payer: Self-pay | Source: Ambulatory Visit | Attending: Internal Medicine | Admitting: Internal Medicine

## 2016-12-16 ENCOUNTER — Other Ambulatory Visit: Payer: Self-pay

## 2016-12-16 ENCOUNTER — Encounter: Payer: Self-pay | Admitting: Internal Medicine

## 2016-12-16 VITALS — BP 117/80 | HR 80 | Temp 99.2°F | Resp 16 | Ht 61.25 in | Wt 180.8 lb

## 2016-12-16 DIAGNOSIS — E785 Hyperlipidemia, unspecified: Secondary | ICD-10-CM | POA: Insufficient documentation

## 2016-12-16 DIAGNOSIS — Z880 Allergy status to penicillin: Secondary | ICD-10-CM | POA: Insufficient documentation

## 2016-12-16 DIAGNOSIS — K648 Other hemorrhoids: Secondary | ICD-10-CM | POA: Insufficient documentation

## 2016-12-16 DIAGNOSIS — R079 Chest pain, unspecified: Secondary | ICD-10-CM | POA: Insufficient documentation

## 2016-12-16 DIAGNOSIS — I350 Nonrheumatic aortic (valve) stenosis: Secondary | ICD-10-CM | POA: Insufficient documentation

## 2016-12-16 DIAGNOSIS — R011 Cardiac murmur, unspecified: Secondary | ICD-10-CM

## 2016-12-16 DIAGNOSIS — L719 Rosacea, unspecified: Secondary | ICD-10-CM | POA: Insufficient documentation

## 2016-12-16 DIAGNOSIS — R0602 Shortness of breath: Secondary | ICD-10-CM | POA: Insufficient documentation

## 2016-12-16 DIAGNOSIS — K219 Gastro-esophageal reflux disease without esophagitis: Secondary | ICD-10-CM | POA: Insufficient documentation

## 2016-12-16 DIAGNOSIS — R9431 Abnormal electrocardiogram [ECG] [EKG]: Secondary | ICD-10-CM | POA: Insufficient documentation

## 2016-12-16 DIAGNOSIS — G5603 Carpal tunnel syndrome, bilateral upper limbs: Secondary | ICD-10-CM | POA: Insufficient documentation

## 2016-12-16 NOTE — Patient Instructions (Signed)
 Dyslipidemia Dyslipidemia is an imbalance of waxy, fat-like substances (lipids) in the blood. The body needs lipids in small amounts. Dyslipidemia often involves a high level of cholesterol or triglycerides, which are types of lipids. Common forms of dyslipidemia include:  High levels of bad cholesterol (LDL cholesterol). LDL is the type of cholesterol that causes fatty deposits (plaques) to build up in the blood vessels that carry blood away from your heart (arteries).  Low levels of good cholesterol (HDL cholesterol). HDL cholesterol is the type of cholesterol that protects against heart disease. High levels of HDL remove the LDL buildup from arteries.  High levels of triglycerides. Triglycerides are a fatty substance in the blood that is linked to a buildup of plaques in the arteries.  You can develop dyslipidemia because of the genes you are born with (primary dyslipidemia) or changes that occur during your life (secondary dyslipidemia), or as a side effect of certain medical treatments. What are the causes? Primary dyslipidemia is caused by changes (mutations) in genes that are passed down through families (inherited). These mutations cause several types of dyslipidemia. Mutations can result in disorders that make the body produce too much LDL cholesterol or triglycerides, or not enough HDL cholesterol. These disorders may lead to heart disease, arterial disease, or stroke at an early age. Causes of secondary dyslipidemia include certain lifestyle choices and diseases that lead to dyslipidemia, such as:  Eating a diet that is high in animal fat.  Not getting enough activity or exercise (having a sedentary lifestyle).  Having diabetes, kidney disease, liver disease, or thyroid disease.  Drinking large amounts of alcohol.  Using certain types of drugs.  What increases the risk? You may be at greater risk for dyslipidemia if you are an older man or if you are a woman who has gone  through menopause. Other risk factors include:  Having a family history of dyslipidemia.  Taking certain medicines, including birth control pills, steroids, some diuretics, beta-blockers, and some medicines forHIV.  Smoking cigarettes.  Eating a high-fat diet.  Drinking large amounts of alcohol.  Having certain medical conditions such as diabetes, polycystic ovary syndrome (PCOS), pregnancy, kidney disease, liver disease, or hypothyroidism.  Not exercising regularly.  Being overweight or obese with too much belly fat.  What are the signs or symptoms? Dyslipidemia does not usually cause any symptoms. Very high lipid levels can cause fatty bumps under the skin (xanthomas) or a white or gray ring around the black center (pupil) of the eye. Very high triglyceride levels can cause inflammation of the pancreas (pancreatitis). How is this diagnosed? Your health care provider may diagnose dyslipidemia based on a routine blood test (fasting blood test). Because most people do not have symptoms of the condition, this blood testing (lipid profile) is done on adults age 20 and older and is repeated every 5 years. This test checks:  Total cholesterol. This is a measure of the total amount of cholesterol in your blood, including LDL cholesterol, HDL cholesterol, and triglycerides. A healthy number is below 200.  LDL cholesterol. The target number for LDL cholesterol is different for each person, depending on individual risk factors. For most people, a number below 100 is healthy. Ask your health care provider what your LDL cholesterol number should be.  HDL cholesterol. An HDL level of 60 or higher is best because it helps to protect against heart disease. A number below 40 for men or below 50 for women increases the risk for heart disease.  Triglycerides.   A healthy triglyceride number is below 150.  If your lipid profile is abnormal, your health care provider may do other blood tests to get more  information about your condition. How is this treated? Treatment depends on the type of dyslipidemia that you have and your other risk factors for heart disease and stroke. Your health care provider will have a target range for your lipid levels based on this information. For many people, treatment starts with lifestyle changes, such as diet and exercise. Your health care provider may recommend that you:  Get regular exercise.  Make changes to your diet.  Quit smoking if you smoke.  If diet changes and exercise do not help you reach your goals, your health care provider may also prescribe medicine to lower lipids. The most commonly prescribed type of medicine lowers your LDL cholesterol (statin drug). If you have a high triglyceride level, your provider may prescribe another type of drug (fibrate) or an omega-3 fish oil supplement, or both. Follow these instructions at home:  Take over-the-counter and prescription medicines only as told by your health care provider. This includes supplements.  Get regular exercise. Start an aerobic exercise and strength training program as told by your health care provider. Ask your health care provider what activities are safe for you. Your health care provider may recommend: ? 30 minutes of aerobic activity 4-6 days a week. Brisk walking is an example of aerobic activity. ? Strength training 2 days a week.  Eat a healthy diet as told by your health care provider. This can help you reach and maintain a healthy weight, lower your LDL cholesterol, and raise your HDL cholesterol. It may help to work with a diet and nutrition specialist (dietitian) to make a plan that is right for you. Your dietitian or health care provider may recommend: ? Limiting your calories, if you are overweight. ? Eating more fruits, vegetables, whole grains, fish, and lean meats. ? Limiting saturated fat, trans fat, and cholesterol.  Follow instructions from your health care provider  or dietitian about eating or drinking restrictions.  Limit alcohol intake to no more than one drink per day for nonpregnant women and two drinks per day for men. One drink equals 12 oz of beer, 5 oz of wine, or 1 oz of hard liquor.  Do not use any products that contain nicotine or tobacco, such as cigarettes and e-cigarettes. If you need help quitting, ask your health care provider.  Keep all follow-up visits as told by your health care provider. This is important. Contact a health care provider if:  You are having trouble sticking to your exercise or diet plan.  You are struggling to quit smoking or control your use of alcohol. Summary  Dyslipidemia is an imbalance of waxy, fat-like substances (lipids) in the blood. The body needs lipids in small amounts. Dyslipidemia often involves a high level of cholesterol or triglycerides, which are types of lipids.  Treatment depends on the type of dyslipidemia that you have and your other risk factors for heart disease and stroke.  For many people, treatment starts with lifestyle changes, such as diet and exercise. Your health care provider may also prescribe medicine to lower lipids. This information is not intended to replace advice given to you by your health care provider. Make sure you discuss any questions you have with your health care provider. Document Released: 06/18/2013 Document Revised: 02/08/2016 Document Reviewed: 02/08/2016 Elsevier Interactive Patient Education  2018 [employer]  

## 2016-12-16 NOTE — Progress Notes (Signed)
Patient ID: PERRY MOLLA, female    DOB: 11/21/1955  MRN: [id number]  CC: Follow-up   Subjective: Nancy Fields is a 61 y.o. female who presents for 1 wk f/u Her concerns today include:  1.  I went over lab results with her. UA was normal, kidney and LFTs, TSH NL.  hep screen negative. CBC normal. LDL chol 169 elevated. -incorporates exercise by parking further away from buildings  2. Saw cardiologist in 2013 prior to hysterectomy.  -states she was told she has "tight valve".  Has AS -c/o intermittent tightness across upper chest over past 2 yrs -had several in last few mths. Seems to occur when "I'm relaxing at home doing nothing"  No radiation.  Lasts only seconds.  Has a full flight of stairs in her house which she goes up and down up and down several times a day. No chest pains when climbing the stairs -some SOB only when carrying groceries bags upstairs -no LE edema  -no fhx of heart disease. She does not smoke  3. Wakes up some mornings with numbness and tingling in one or both  Arm/hands but more on RT. -On further questioning she endorses that the symptoms are more so in her hands  -has to move arms/hands "to get blood circulating." -no neck pain.  4.  No further bleeding from hemorrhoids. She has decided against referral to general surgeon to have hemorrhoids removed.  Patient Active Problem List   Diagnosis Date Noted  . Hemorrhoid prolapse 12/09/2016  . Gastroesophageal reflux disease without esophagitis 12/09/2016  . Heat intolerance 12/09/2016  . Aortic valve stenosis, moderate 04/10/2012  . Acne rosacea 03/26/2012     No current outpatient prescriptions on file prior to visit.   No current facility-administered medications on file prior to visit.     Allergies  Allergen Reactions  . Penicillins Hives  . Latex Rash    Social History   Social History  . Marital status: Divorced    Spouse name: N/A  . Number of children: N/A  . Years of education: N/A     Occupational History  . Not on file.   Social History Main Topics  . Smoking status: Never Smoker  . Smokeless tobacco: Never Used  . Alcohol use Yes     Comment: social  . Drug use: No  . Sexual activity: No   Other Topics Concern  . Not on file   Social History Narrative  . No narrative on file    Family History  Problem Relation Age of Onset  . Cancer Mother 32       lung cancer  . Stroke Father   . Cancer Brother        skin cancer  . Cancer Maternal Aunt        stomach  . Stroke Maternal Grandmother   . Cancer Maternal Grandfather        colon    Past Surgical History:  Procedure Laterality Date  . ABCESS DRAINAGE    . APPENDECTOMY    . bilateral tubal ligation    . DILATION AND CURETTAGE OF UTERUS    . DILATION AND CURETTAGE OF UTERUS  06/22/2012   Procedure: DILATATION AND CURETTAGE;  Surgeon: Donnamae Jude, MD;  Location: [facility];  Service: Gynecology;  Laterality: N/A;  . SALPINGOOPHORECTOMY  07/09/2012   Procedure: SALPINGO OOPHORECTOMY;  Surgeon: Donnamae Jude, MD;  Location: [facility];  Service: Gynecology;  Laterality: Bilateral;  . TUBAL  LIGATION    . VAGINAL HYSTERECTOMY  07/09/2012   Procedure: HYSTERECTOMY VAGINAL;  Surgeon: Donnamae Jude, MD;  Location: [facility];  Service: Gynecology;  Laterality: N/A;  . WISDOM TOOTH EXTRACTION      ROS: Review of Systems As stated above PHYSICAL EXAM: BP 117/80 (BP Location: Left Arm, Patient Position: Sitting, Cuff Size: Normal)   Pulse 80   Temp 99.2 F (37.3 C) (Oral)   Resp 16   Ht 5' 1.25" (1.556 m)   Wt 180 lb 12.8 oz (82 kg)   LMP 02/24/2012   SpO2 97%   BMI 33.88 kg/m   Wt Readings from Last 3 Encounters:  12/16/16 180 lb 12.8 oz (82 kg)  12/09/16 178 lb (80.7 kg)  11/22/16 178 lb (80.7 kg)   Physical Exam  General appearance - alert, well appearing, and in no distress Mental status - alert, oriented to person, place, and time, normal mood, behavior, speech, dress, motor activity, and  thought processes Neck - supple, no significant adenopathy Chest - clear to auscultation, no wheezes, rales or rhonchi, symmetric air entry Heart -2/6 systolic ejection murmur left and right upper sternal borders  Extremities -no lower extremity edema. 3+ radial and brachial pulses bilaterally. Pulses remain brisk with movement and elevation of arms above the head. Neuro: Grip 5/5 bilaterally. Tinel's sign positive on the right.  Results for orders placed or performed in visit on 12/09/16  Comprehensive metabolic panel  Result Value Ref Range   Glucose 84 65 - 99 mg/dL   BUN 14 8 - 27 mg/dL   Creatinine, Ser 0.63 0.57 - 1.00 mg/dL   GFR calc non Af Amer 96 >59 mL/min/1.73   GFR calc Af Amer 111 >59 mL/min/1.73   BUN/Creatinine Ratio 22 12 - 28   Sodium 143 134 - 144 mmol/L   Potassium 4.3 3.5 - 5.2 mmol/L   Chloride 106 96 - 106 mmol/L   CO2 22 20 - 29 mmol/L   Calcium 9.6 8.7 - 10.3 mg/dL   Total Protein 7.4 6.0 - 8.5 g/dL   Albumin 4.6 3.6 - 4.8 g/dL   Globulin, Total 2.8 1.5 - 4.5 g/dL   Albumin/Globulin Ratio 1.6 1.2 - 2.2   Bilirubin Total 0.6 0.0 - 1.2 mg/dL   Alkaline Phosphatase 83 39 - 117 IU/L   AST 19 0 - 40 IU/L   ALT 16 0 - 32 IU/L  CBC  Result Value Ref Range   WBC 6.2 3.4 - 10.8 x10E3/uL   RBC 4.84 3.77 - 5.28 x10E6/uL   Hemoglobin 15.7 11.1 - 15.9 g/dL   Hematocrit 45.7 34.0 - 46.6 %   MCV 94 79 - 97 fL   MCH 32.4 26.6 - 33.0 pg   MCHC 34.4 31.5 - 35.7 g/dL   RDW 13.3 12.3 - 15.4 %   Platelets 265 150 - 379 x10E3/uL  Lipid panel  Result Value Ref Range   Cholesterol, Total 284 (H) 100 - 199 mg/dL   Triglycerides 147 0 - 149 mg/dL   HDL 86 >39 mg/dL   VLDL Cholesterol Cal 29 5 - 40 mg/dL   LDL Calculated 169 (H) 0 - 99 mg/dL   Chol/HDL Ratio 3.3 0.0 - 4.4 ratio  Hepatitis C Antibody  Result Value Ref Range   Hep C Virus Ab <0.1 0.0 - 0.9 s/co ratio  Hepatitis B surface antigen  Result Value Ref Range   Hepatitis B Surface Ag Negative Negative  TSH    Result Value  Ref Range   TSH 2.590 0.450 - 4.500 uIU/mL  Urinalysis  Result Value Ref Range   Specific Gravity, UA 1.015 1.005 - 1.030   pH, UA 5.0 5.0 - 7.5   Color, UA Yellow Yellow   Appearance Ur Clear Clear   Leukocytes, UA Negative Negative   Protein, UA Negative Negative/Trace   Glucose, UA Negative Negative   Ketones, UA Negative Negative   RBC, UA 1+ (A) Negative   Bilirubin, UA Negative Negative   Urobilinogen, Ur 0.2 0.2 - 1.0 mg/dL   Nitrite, UA Negative Negative   EKG: Normal sinus rhythm. T-wave inversion in leads 3 and change from previous EKG 2013 ASSESSMENT AND PLAN: 1. Chest pain in adult -Chest pain sounds atypical. However she does have AS and this can cause chest pain and shortness of breath depending on severity. However I would expect to see chest pains with exertion. I recommend echo to evaluate the AS and if severity has not changed then we will proceed with stress test. -Patient decided however to hold off on scheduling the echo stating that she has to pay off some other bills first. -She declines applying for [facility] card or [facility] discount because she does not want to release information that she would be required to give. States that she will call us back when she is ready to schedule the echo - EKG 12-Lead  2. Bilateral carpal tunnel syndrome -Discussed diagnoses. Recommended trial of cockup wrist splints. Patient declined the wrist splints stating that as long as she knows this is not something serious she can live with it  3. Hemorrhoid prolapse Patient declines referral to Gen. surgery  4. Heart murmur - ECHOCARDIOGRAM COMPLETE; Future  5. Hyperlipidemia, unspecified hyperlipidemia type Discussed lipid profile. Discussed healthy eating habits.  Patient was given the opportunity to ask questions.  Patient verbalized understanding of the plan and was able to repeat key elements of the plan.   Orders Placed This Encounter  Procedures  . EKG  12-Lead  . ECHOCARDIOGRAM COMPLETE     Requested Prescriptions    No prescriptions requested or ordered in this encounter    No Follow-up on file.  Karle Plumber, MD, FACP

## 2017-01-02 ENCOUNTER — Ambulatory Visit
Admission: RE | Admit: 2017-01-02 | Discharge: 2017-01-02 | Disposition: A | Discharge status: Home / Self Care | Payer: Self-pay | Source: Ambulatory Visit | Attending: Family Medicine | Admitting: Family Medicine

## 2017-01-02 DIAGNOSIS — Z1231 Encounter for screening mammogram for malignant neoplasm of breast: Secondary | ICD-10-CM
